# Patient Record
Sex: Female | Born: 1965 | Race: Black or African American | Hispanic: No | Marital: Married | Smoking: Former smoker
Health system: Southern US, Community
[De-identification: ages and names within clinical notes are randomized; demographics above are authoritative.]

## PROBLEM LIST (undated history)

## (undated) DIAGNOSIS — I10 Essential (primary) hypertension: Secondary | ICD-10-CM

## (undated) DIAGNOSIS — R7303 Prediabetes: Secondary | ICD-10-CM

## (undated) DIAGNOSIS — L309 Dermatitis, unspecified: Secondary | ICD-10-CM

## (undated) DIAGNOSIS — R001 Bradycardia, unspecified: Secondary | ICD-10-CM

## (undated) DIAGNOSIS — E785 Hyperlipidemia, unspecified: Secondary | ICD-10-CM

## (undated) DIAGNOSIS — E669 Obesity, unspecified: Secondary | ICD-10-CM

## (undated) DIAGNOSIS — L83 Acanthosis nigricans: Secondary | ICD-10-CM

## (undated) HISTORY — DX: Dermatitis, unspecified: L30.9

## (undated) HISTORY — DX: Bradycardia, unspecified: R00.1

## (undated) HISTORY — DX: Essential (primary) hypertension: I10

## (undated) HISTORY — DX: Acanthosis nigricans: L83

## (undated) HISTORY — PX: ABDOMINAL HYSTERECTOMY: SHX81

## (undated) HISTORY — PX: SKIN BIOPSY: SHX1

## (undated) HISTORY — DX: Hyperlipidemia, unspecified: E78.5

## (undated) HISTORY — DX: Obesity, unspecified: E66.9

---
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
## 2005-08-07 ENCOUNTER — Other Ambulatory Visit: Payer: Self-pay

## 2005-08-22 ENCOUNTER — Ambulatory Visit: Payer: Self-pay | Admitting: Unknown Physician Specialty

## 2005-12-05 IMAGING — CR DG CHEST 2V
1 series · 2 of 2 positions shown · non-contrast
Comparison: none

REASON FOR EXAM: Hypertension
COMMENTS:

PROCEDURE:     DXR - DXR CHEST PA (OR AP) AND LATERAL  - August 07, 2005 [DATE]
RESULT:     PA and lateral views reveal the heart to be within normal limits
in size. The lung fields are clear. The vascularity is within normal limits
with no effusions identified.

[Series 2464: postero_anterior · 0.11mm/px · 2 of 2 slices shown]
[im 1/2]
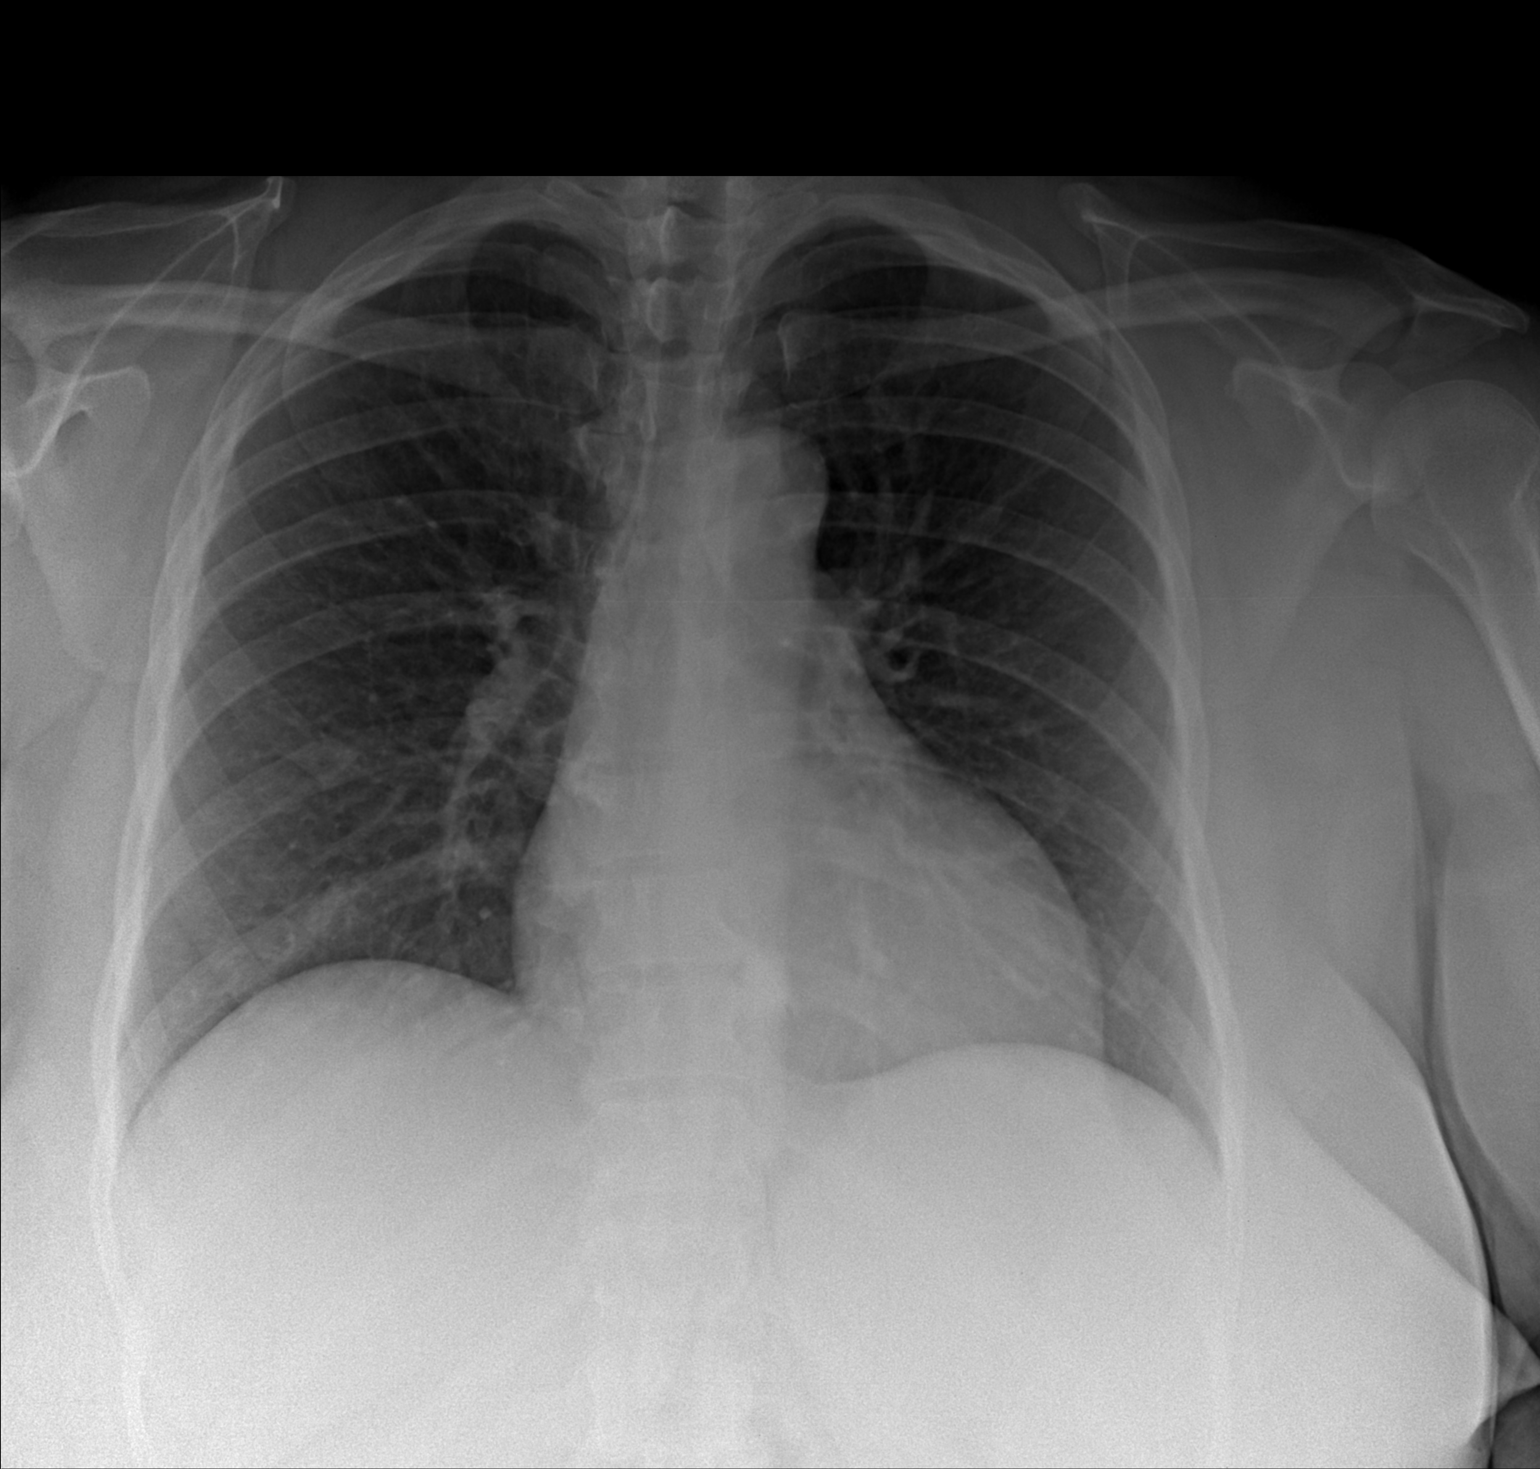
[im 2/2]
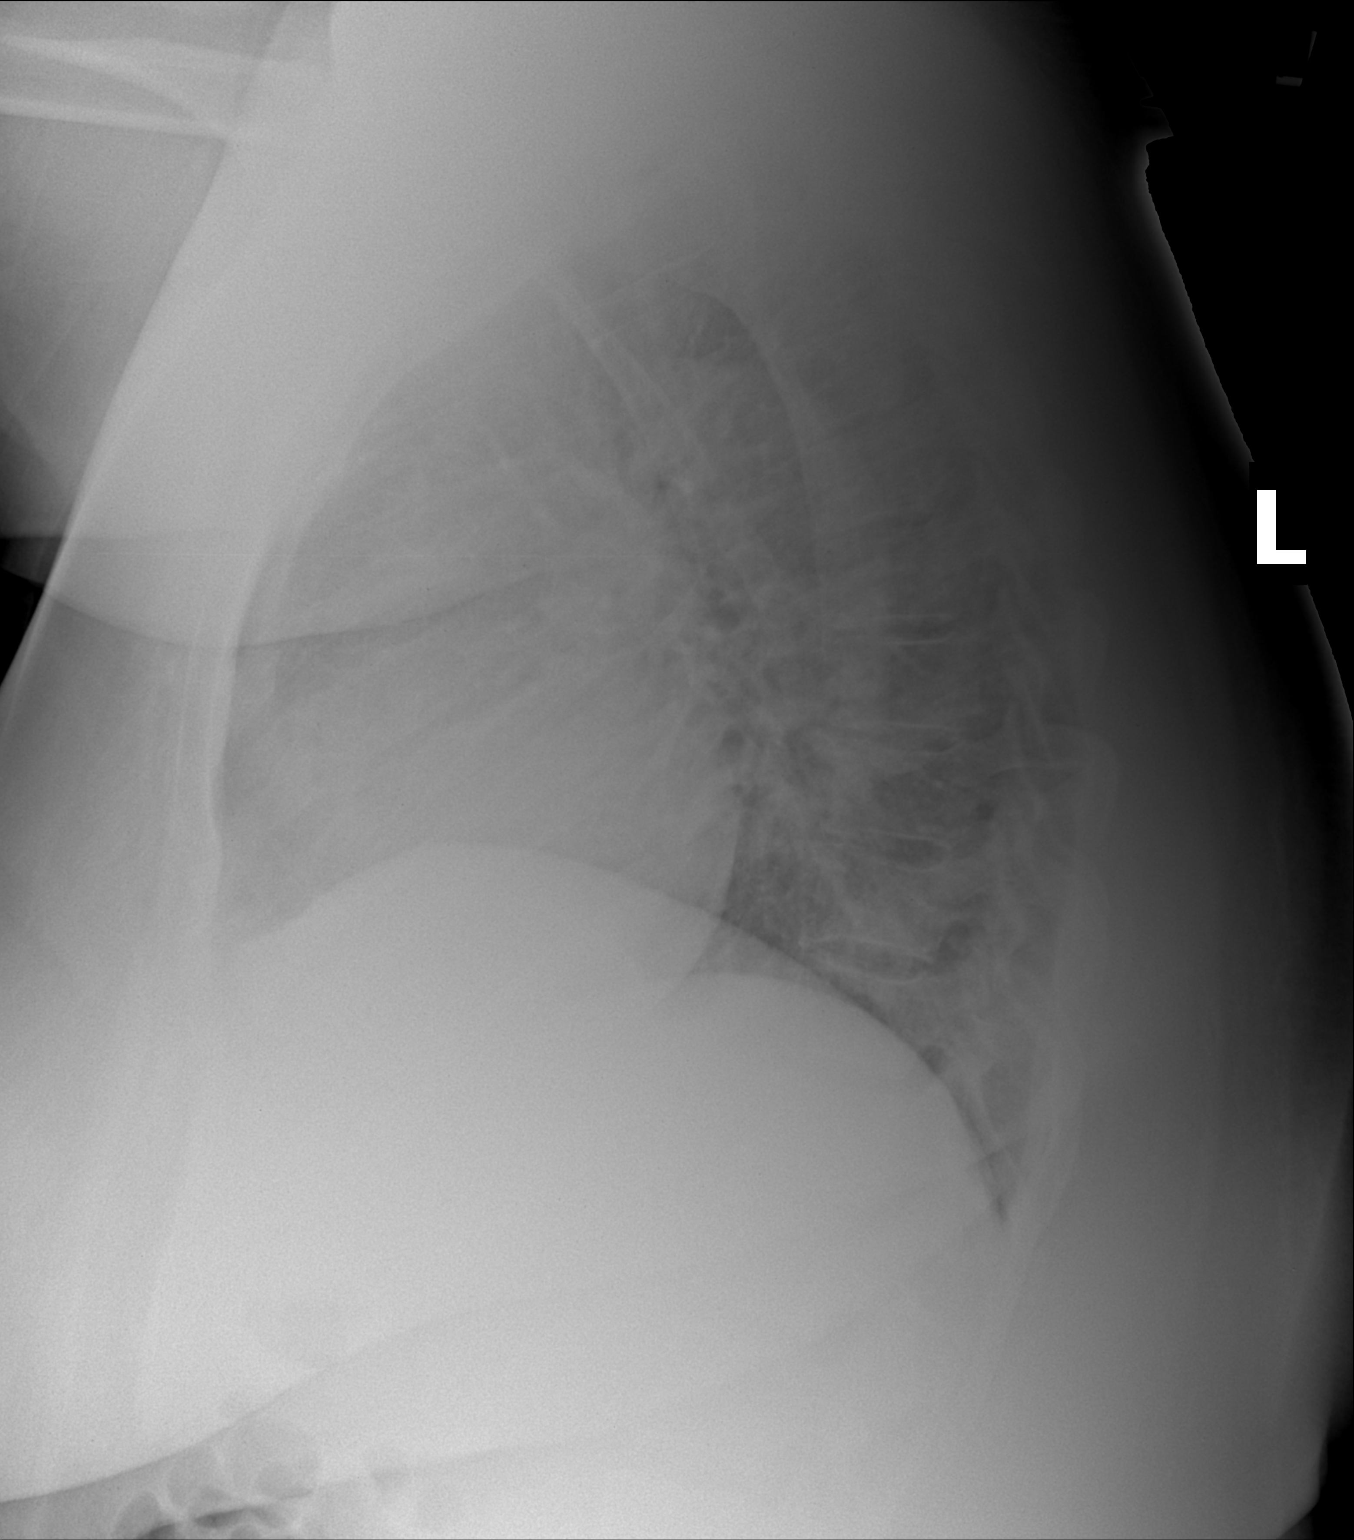

[2 of 2 positions shown; findings below may reference images not displayed]

IMPRESSION: The lung fields are clear.

## 2009-12-10 LAB — HM MAMMOGRAPHY: HM Mammogram: NORMAL

## 2011-07-19 ENCOUNTER — Encounter: Payer: Self-pay | Admitting: Internal Medicine

## 2011-07-24 ENCOUNTER — Ambulatory Visit: Payer: BC Managed Care – PPO | Admitting: Internal Medicine

## 2011-11-05 ENCOUNTER — Other Ambulatory Visit: Payer: Self-pay | Admitting: Internal Medicine

## 2011-11-06 ENCOUNTER — Other Ambulatory Visit: Payer: Self-pay | Admitting: Internal Medicine

## 2011-11-22 ENCOUNTER — Encounter: Payer: Self-pay | Admitting: Internal Medicine

## 2011-12-12 ENCOUNTER — Encounter: Payer: Self-pay | Admitting: Internal Medicine

## 2011-12-12 ENCOUNTER — Ambulatory Visit (INDEPENDENT_AMBULATORY_CARE_PROVIDER_SITE_OTHER): Payer: BC Managed Care – PPO | Admitting: Internal Medicine

## 2011-12-12 VITALS — BP 132/86 | HR 90 | Temp 98.0°F | Resp 18 | Ht 65.0 in | Wt 286.0 lb

## 2011-12-12 DIAGNOSIS — Z79899 Other long term (current) drug therapy: Secondary | ICD-10-CM

## 2011-12-12 DIAGNOSIS — I10 Essential (primary) hypertension: Secondary | ICD-10-CM

## 2011-12-12 DIAGNOSIS — R7302 Impaired glucose tolerance (oral): Secondary | ICD-10-CM

## 2011-12-12 DIAGNOSIS — E785 Hyperlipidemia, unspecified: Secondary | ICD-10-CM | POA: Insufficient documentation

## 2011-12-12 DIAGNOSIS — I1A0 Resistant hypertension: Secondary | ICD-10-CM | POA: Insufficient documentation

## 2011-12-12 DIAGNOSIS — R7309 Other abnormal glucose: Secondary | ICD-10-CM

## 2011-12-12 DIAGNOSIS — R7303 Prediabetes: Secondary | ICD-10-CM | POA: Insufficient documentation

## 2011-12-12 DIAGNOSIS — E669 Obesity, unspecified: Secondary | ICD-10-CM

## 2011-12-12 LAB — COMPLETE METABOLIC PANEL WITH GFR
ALT: 15 U/L (ref 0–35)
AST: 34 U/L (ref 0–37)
Albumin: 4.2 g/dL (ref 3.5–5.2)
Alkaline Phosphatase: 96 U/L (ref 39–117)
Glucose, Bld: 98 mg/dL (ref 70–99)
Potassium: 5 mEq/L (ref 3.5–5.3)
Sodium: 138 mEq/L (ref 135–145)
Total Protein: 6.9 g/dL (ref 6.0–8.3)

## 2011-12-12 LAB — LIPID PANEL
Total CHOL/HDL Ratio: 5
VLDL: 21.8 mg/dL (ref 0.0–40.0)

## 2011-12-12 MED ORDER — AMLODIPINE BESYLATE 10 MG PO TABS: 10.0000 mg | ORAL_TABLET | Freq: Every day | ORAL | Status: DC

## 2011-12-12 MED ORDER — TRIAMTERENE-HCTZ 37.5-25 MG PO TABS: 1.0000 | ORAL_TABLET | Freq: Every day | ORAL | Status: DC

## 2011-12-12 MED ORDER — LISINOPRIL 40 MG PO TABS: 40.0000 mg | ORAL_TABLET | Freq: Every day | ORAL | Status: DC

## 2011-12-12 NOTE — Patient Instructions (Addendum)
Consider the Low Glycemic Index Diet and 6 smaller meals daily :   7 AM Low carbohydrate Protein  Shakes (EAS Carb Control  Or Atkins ,  Available everywhere,   In  cases at BJs )  2.5 carbs  (Add or substitute a toasted sandwhich thin w/ peanut butter)  10 AM: Protein bar by Atkins (snack size,  Chocolate lover's variety at  BJ's)    Lunch: sandwich on pita bread or flatbread (Joseph's makes a pita bread and a flat bread , available at [employer] and BJ's; Toufayah makes a low carb flatbread available at [employer] and [employer]) [employer] and Mission both make a low carb whole wheat tortilla  3 PM:  Mid day :  Another protein bar,  Or a  cheese stick, 1/4 cup of almonds, walnuts, pistachios, pecans, peanuts,  Macadamia nuts  6 PM  Dinner:  "mean and green:"  Meat/chicken/fish, salad, and green veggie : use ranch, vinagrette,  Blue cheese, etc  9 PM snack : Breyer's low carb fudgsicle or  ice cream bar (Carb Smart) Weight Watcher's ice cream bar , or another protein shake or a Therapist, nutritional

## 2011-12-12 NOTE — Progress Notes (Signed)
Patient ID: Amy House, female   DOB: [birth year], 46 y.o.   MRN: [id number]  Patient Active Problem List  Diagnoses  . Hypertension  . Obesity (BMI 30-39.9)  . Hyperlipidemia  . Impaired glucose tolerance    Subjective:  CC:   Chief Complaint  Patient presents with  . Follow-up    HPI:   Amy House a 46 y.o. female who presents  Follow up onobesity and hypertension.  She has not been seen in over 8 months,  She ha  not lost weight ,  Not exercising regularly. Considering enrolling in the bariatric clinic around the corner from her house. Frustrated at her lack of knowledge about what to eat.  Has been worried about salt and sugar content of foods,  But is eating Honey nut cheerios,  Crackers and fruit for breakfast, prepared past meals,  Doesn't really understand what to avoid. Taking her bp medications regularly,  No side effects.  Home bps have been 130/80 or less.    Past Medical History  Diagnosis Date  . Bradycardia   . Hyperlipidemia   . Hypertension   . Obesity (BMI 30-39.9)     Past Surgical History  Procedure Date  . Abdominal hysterectomy   . Tonsillectomy 1979         The following portions of the patient's history were reviewed and updated as appropriate: Allergies, current medications, and problem list.    Review of Systems:   12 Pt  review of systems was negative except those addressed in the HPI,     History   Social History  . Marital Status: Married    Spouse Name: N/A    Number of Children: N/A  . Years of Education: N/A   Occupational History  . Not on file.   Social History Main Topics  . Smoking status: Never Smoker   . Smokeless tobacco: Never Used  . Alcohol Use: No  . Drug Use: No  . Sexually Active: Not on file   Other Topics Concern  . Not on file   Social History Narrative  . No narrative on file    Objective:  BP 132/86  Pulse 90  Temp(Src) 98 F (36.7 C) (Oral)  Resp 18  Ht 5\' 5"  (1.651 m)   Wt 286 lb (129.729 kg)  BMI 47.59 kg/m2  SpO2 97%  General appearance: alert, cooperative and appears stated age Ears: normal TM's and external ear canals both ears Throat: lips, mucosa, and tongue normal; teeth and gums normal Neck: no adenopathy, no carotid bruit, supple, symmetrical, trachea midline and thyroid not enlarged, symmetric, no tenderness/mass/nodules Back: symmetric, no curvature. ROM normal. No CVA tenderness. Lungs: clear to auscultation bilaterally Heart: regular rate and rhythm, S1, S2 normal, no murmur, click, rub or gallop Abdomen: soft, non-tender; bowel sounds normal; no masses,  no organomegaly Pulses: 2+ and symmetric Skin: Skin color, texture, turgor normal. No rashes or lesions Lymph nodes: Cervical, supraclavicular, and axillary nodes normal.  Assessment and Plan:  Hypertension Slightly elevated,as whit e coat hypertension.  Home bps have been 130/80 or less  Impaired glucose tolerance a1c was 5.9 last July, with no diagnostic fasting  glucoses. Will recheck, recommend low GI diet.   Obesity (BMI 30-39.9) I have addressed  BMI and recommended a low glycemic index diet utilizing smaller more frequent meals to increase metabolism.  I have also recommended that patient start exercising with a goal of 30 minutes of aerobic exercise a minimum of 5 days per week.  Screening for lipid disorders, thyroid and diabetes to be done today. Spent 20 minutes today discussing diet.      Updated Medication List Outpatient Encounter Prescriptions as of 12/12/2011  Medication Sig Dispense Refill  . amLODipine (NORVASC) 10 MG tablet Take 1 tablet (10 mg total) by mouth daily.  30 tablet  6  . aspirin 81 MG tablet Take 81 mg by mouth daily.        Marland Kitchen lisinopril (PRINIVIL,ZESTRIL) 40 MG tablet Take 1 tablet (40 mg total) by mouth daily.  30 tablet  3  . triamterene-hydrochlorothiazide (MAXZIDE-25) 37.5-25 MG per tablet Take 1 each (1 tablet total) by mouth daily.  30 tablet  3    . DISCONTD: amLODipine (NORVASC) 10 MG tablet TAKE ONE TABLET BY MOUTH EVERY DAY FOR BLOOD PRESSURE  30 tablet  6  . DISCONTD: lisinopril (PRINIVIL,ZESTRIL) 40 MG tablet Take 40 mg by mouth daily.        Marland Kitchen DISCONTD: triamterene-hydrochlorothiazide (MAXZIDE-25) 37.5-25 MG per tablet Take 1 tablet by mouth daily.        Marland Kitchen DISCONTD: fish oil-omega-3 fatty acids 1000 MG capsule Take 2 g by mouth daily.        Marland Kitchen DISCONTD: hydrochlorothiazide (HYDRODIURIL) 25 MG tablet Take 25 mg by mouth daily.        Marland Kitchen DISCONTD: potassium chloride SA (K-DUR,KLOR-CON) 20 MEQ tablet Take 20 mEq by mouth 2 (two) times daily.           Orders Placed This Encounter  Procedures  . TSH  . Lipid panel  . COMPLETE METABOLIC PANEL WITH GFR  . Hemoglobin A1c    Return in about 3 months (around 03/12/2012).

## 2011-12-12 NOTE — Assessment & Plan Note (Signed)
a1c was 5.9 last July, with no diagnostic fasting  glucoses. Will recheck, recommend low GI diet.

## 2011-12-12 NOTE — Assessment & Plan Note (Addendum)
Slightly elevated,as whit e coat hypertension.  Home bps have been 130/80 or less

## 2011-12-12 NOTE — Assessment & Plan Note (Signed)
I have addressed  BMI and recommended a low glycemic index diet utilizing smaller more frequent meals to increase metabolism.  I have also recommended that patient start exercising with a goal of 30 minutes of aerobic exercise a minimum of 5 days per week. Screening for lipid disorders, thyroid and diabetes to be done today. Spent 20 minutes today discussing diet.

## 2012-08-05 ENCOUNTER — Ambulatory Visit: Payer: BC Managed Care – PPO | Admitting: Internal Medicine

## 2012-08-07 ENCOUNTER — Encounter: Payer: Self-pay | Admitting: Internal Medicine

## 2012-08-07 ENCOUNTER — Ambulatory Visit (INDEPENDENT_AMBULATORY_CARE_PROVIDER_SITE_OTHER): Payer: BC Managed Care – PPO | Admitting: Internal Medicine

## 2012-08-07 VITALS — BP 136/80 | HR 86 | Temp 98.1°F | Resp 12 | Ht 65.0 in | Wt 290.5 lb

## 2012-08-07 DIAGNOSIS — E669 Obesity, unspecified: Secondary | ICD-10-CM

## 2012-08-07 DIAGNOSIS — R5383 Other fatigue: Secondary | ICD-10-CM

## 2012-08-07 DIAGNOSIS — J069 Acute upper respiratory infection, unspecified: Secondary | ICD-10-CM

## 2012-08-07 DIAGNOSIS — R5381 Other malaise: Secondary | ICD-10-CM

## 2012-08-07 DIAGNOSIS — E785 Hyperlipidemia, unspecified: Secondary | ICD-10-CM

## 2012-08-07 DIAGNOSIS — M549 Dorsalgia, unspecified: Secondary | ICD-10-CM

## 2012-08-07 DIAGNOSIS — I10 Essential (primary) hypertension: Secondary | ICD-10-CM

## 2012-08-07 LAB — POCT URINALYSIS DIPSTICK
Bilirubin, UA: NEGATIVE
Glucose, UA: NEGATIVE
Ketones, UA: NEGATIVE
Spec Grav, UA: 1.02

## 2012-08-07 MED ORDER — LEVOFLOXACIN 500 MG PO TABS: 500.0000 mg | ORAL_TABLET | Freq: Every day | ORAL | Status: DC

## 2012-08-07 NOTE — Patient Instructions (Addendum)
  You have a viral  Syndrome .  The post nasal drip is causing your sore throat.  Lavage your sinuses twice daily with Simply saline nasal spray.  Use generic benadryl 25 mg every 8 hours for drainage and Sudafed PE 10 to 30 every 8 hours to manage the congestion.  Gargle with salt water often for the sore throat.  Try OTC Delsym as a cough suppressant or Robitussin DM if you need to thin out your drainage .  If the throat is no better  In 3 to 4 days OR  if you develop T > 100.4,  Green nasal discharge,  Or facial pain, start the levaquin.

## 2012-08-07 NOTE — Progress Notes (Signed)
Patient ID: Amy House, female   DOB: 1966-08-10, 46 y.o.   MRN: [id number]  Patient Active Problem List  Diagnosis  . Hypertension  . Obesity (BMI 30-39.9)  . Hyperlipidemia  . Impaired glucose tolerance  . Acute URI of multiple sites    Subjective:  CC:   Chief Complaint  Patient presents with  . Sinus Problem    HPI:   Amy House a 46 y.o. female who presents with a 2 week history of sore throat, sinus drainage accompanied by productive cough.  No fevers or chills.  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  She has tried gargling with salt water for scratchy throat with improvement but continues to have frequent cough and sinus congestion with mild headache. 2) obesity.  She is not exercising regularly and not following any specific diet.  She is currently unmotivated but wants to find a companion to start exercising with and is considering going to the bariatric Center for  a formalized weight loss program which inclues B12 injections.    Past Medical History  Diagnosis Date  . Bradycardia   . Hyperlipidemia   . Hypertension   . Obesity (BMI 30-39.9)     Past Surgical History  Procedure Date  . Abdominal hysterectomy   . Tonsillectomy 1979     The following portions of the patient's history were reviewed and updated as appropriate: Allergies, current medications, and problem list.  Review of Systems:   Patient denies  fevers,  unintentional weight loss, skin rash, eye pain, dysphagia,  hemoptysis , dyspnea, wheezing, chest pain, palpitations, orthopnea, edema, abdominal pain, nausea, melena, diarrhea, constipation, flank pain, dysuria, hematuria, urinary frequency, nocturia, numbness, tingling, seizures,  Focal weakness, Loss of consciousness,  Tremor, insomnia, depression, anxiety, and suicidal ideation.         History   Social History  . Marital Status: Married    Spouse Name: N/A    Number of Children: N/A  . Years of Education: N/A   Occupational  History  . Not on file.   Social History Main Topics  . Smoking status: Never Smoker   . Smokeless tobacco: Never Used  . Alcohol Use: No  . Drug Use: No  . Sexually Active: Not on file   Other Topics Concern  . Not on file   Social History Narrative  . No narrative on file    Objective:  BP 136/80  Pulse 86  Temp 98.1 F (36.7 C) (Oral)  Resp 12  Ht 5\' 5"  (1.651 m)  Wt 290 lb 8 oz (131.77 kg)  BMI 48.34 kg/m2  SpO2 97%  General appearance: alert, cooperative and appears stated age Ears: normal TM's and external ear canals both ears Throat: lips, mucosa, and tongue normal; teeth and gums normal Neck: no adenopathy, no carotid bruit, supple, symmetrical, trachea midline and thyroid not enlarged, symmetric, no tenderness/mass/nodules Back: symmetric, no curvature. ROM normal. No CVA tenderness. Lungs: clear to auscultation bilaterally Heart: regular rate and rhythm, S1, S2 normal, no murmur, click, rub or gallop Abdomen: soft, non-tender; bowel sounds normal; no masses,  no organomegaly Pulses: 2+ and symmetric Skin: Skin color, texture, turgor normal. No rashes or lesions Lymph nodes: Cervical, supraclavicular, and axillary nodes normal.  Assessment and Plan:  Acute URI of multiple sites This URI is most likely viral given the persistence of mild HEENT  symptoms .  I have explained that in viral URIS, an antibiotic will not help the symptoms and will increase the risk of developing diarrhea.,  Continue oral and nasal decongestants,  Ibuprofen 400 mg and tylenol 650 mq 8 hrs for aches and pains,  Add  abx only if symptoms worsen to include fevers, facial pain, purulent sputum./drainage.   Hypertension Well controlled on current regimen., no changes today.  Obesity (BMI 30-39.9) I have addressed  BMI and recommended a low glycemic index diet utilizing smaller more frequent meals to increase metabolism.  I have also recommended that patient start exercising with a goal  of 30 minutes of aerobic exercise a minimum of 5 days per week. Screening for lipid disorders, thyroid and diabetes to be done today.     Updated Medication List Outpatient Encounter Prescriptions as of 08/07/2012  Medication Sig Dispense Refill  . amLODipine (NORVASC) 10 MG tablet Take 1 tablet (10 mg total) by mouth daily.  30 tablet  6  . aspirin 81 MG tablet Take 81 mg by mouth daily.        Marland Kitchen lisinopril (PRINIVIL,ZESTRIL) 40 MG tablet Take 1 tablet (40 mg total) by mouth daily.  30 tablet  3  . triamterene-hydrochlorothiazide (MAXZIDE-25) 37.5-25 MG per tablet Take 1 each (1 tablet total) by mouth daily.  30 tablet  3  . levofloxacin (LEVAQUIN) 500 MG tablet Take 1 tablet (500 mg total) by mouth daily.  7 tablet  0     Orders Placed This Encounter  Procedures  . Urine culture  . Lipid panel  . Microalbumin / creatinine urine ratio  . Comprehensive metabolic panel  . TSH  . POCT Urinalysis Dipstick    No Follow-up on file.

## 2012-08-08 ENCOUNTER — Encounter: Payer: Self-pay | Admitting: Internal Medicine

## 2012-08-08 DIAGNOSIS — J069 Acute upper respiratory infection, unspecified: Secondary | ICD-10-CM | POA: Insufficient documentation

## 2012-08-08 LAB — URINE CULTURE: Organism ID, Bacteria: NO GROWTH

## 2012-08-08 NOTE — Assessment & Plan Note (Signed)
I have addressed  BMI and recommended a low glycemic index diet utilizing smaller more frequent meals to increase metabolism.  I have also recommended that patient start exercising with a goal of 30 minutes of aerobic exercise a minimum of 5 days per week. Screening for lipid disorders, thyroid and diabetes to be done today.   

## 2012-08-08 NOTE — Assessment & Plan Note (Signed)
This URI is most likely viral given the persistence of mild HEENT  symptoms .  I have explained that in viral URIS, an antibiotic will not help the symptoms and will increase the risk of developing diarrhea.,  Continue oral and nasal decongestants,  Ibuprofen 400 mg and tylenol 650 mq 8 hrs for aches and pains,  Add  abx only if symptoms worsen to include fevers, facial pain, purulent sputum./drainage.

## 2012-08-08 NOTE — Assessment & Plan Note (Signed)
Well controlled on current regimen, no changes today. 

## 2012-09-05 ENCOUNTER — Telehealth: Payer: Self-pay | Admitting: Internal Medicine

## 2012-09-05 MED ORDER — LISINOPRIL 40 MG PO TABS: 40.0000 mg | ORAL_TABLET | Freq: Every day | ORAL | Status: DC

## 2012-09-05 NOTE — Telephone Encounter (Signed)
Med filled.  

## 2012-09-05 NOTE — Telephone Encounter (Signed)
Refill request for lisinopril 40 mg tab Qty: 30 Sig: take one tablet by mouth every day

## 2012-10-02 LAB — HM MAMMOGRAPHY: HM Mammogram: NORMAL

## 2012-10-18 ENCOUNTER — Other Ambulatory Visit: Payer: Self-pay | Admitting: General Practice

## 2012-10-18 MED ORDER — AMLODIPINE BESYLATE 10 MG PO TABS: 10.0000 mg | ORAL_TABLET | Freq: Every day | ORAL | Status: DC

## 2012-10-18 NOTE — Telephone Encounter (Signed)
Pt called requesting amlodipine refill. Med filled.

## 2012-10-18 NOTE — Telephone Encounter (Signed)
Fixed should have been 90 day not 30 day supply.

## 2013-03-13 ENCOUNTER — Telehealth: Payer: Self-pay | Admitting: Internal Medicine

## 2013-03-13 DIAGNOSIS — Z79899 Other long term (current) drug therapy: Secondary | ICD-10-CM

## 2013-03-13 DIAGNOSIS — E785 Hyperlipidemia, unspecified: Secondary | ICD-10-CM

## 2013-03-13 NOTE — Addendum Note (Signed)
Addended by: Sherlene Shams on: 03/13/2013 12:50 PM   Modules accepted: Orders

## 2013-03-13 NOTE — Telephone Encounter (Signed)
Pt made an appt for BP check that has been scheduled for 7/18 at 10 a.m.  Pt states she should needs labs done at that time.

## 2013-03-28 ENCOUNTER — Ambulatory Visit: Payer: BC Managed Care – PPO | Admitting: Internal Medicine

## 2013-04-01 ENCOUNTER — Encounter: Payer: Self-pay | Admitting: Internal Medicine

## 2013-04-01 ENCOUNTER — Ambulatory Visit (INDEPENDENT_AMBULATORY_CARE_PROVIDER_SITE_OTHER): Payer: BC Managed Care – PPO | Admitting: Internal Medicine

## 2013-04-01 VITALS — BP 120/68 | HR 78 | Temp 98.0°F | Resp 14 | Wt 291.2 lb

## 2013-04-01 DIAGNOSIS — I10 Essential (primary) hypertension: Secondary | ICD-10-CM

## 2013-04-01 DIAGNOSIS — E785 Hyperlipidemia, unspecified: Secondary | ICD-10-CM

## 2013-04-01 DIAGNOSIS — R7302 Impaired glucose tolerance (oral): Secondary | ICD-10-CM

## 2013-04-01 DIAGNOSIS — R7309 Other abnormal glucose: Secondary | ICD-10-CM

## 2013-04-01 DIAGNOSIS — E669 Obesity, unspecified: Secondary | ICD-10-CM

## 2013-04-01 DIAGNOSIS — R350 Frequency of micturition: Secondary | ICD-10-CM

## 2013-04-01 DIAGNOSIS — B009 Herpesviral infection, unspecified: Secondary | ICD-10-CM

## 2013-04-01 LAB — COMPREHENSIVE METABOLIC PANEL
ALT: 16 U/L (ref 0–35)
AST: 19 U/L (ref 0–37)
Albumin: 4 g/dL (ref 3.5–5.2)
Alkaline Phosphatase: 106 U/L (ref 39–117)
BUN: 11 mg/dL (ref 6–23)
Chloride: 101 mEq/L (ref 96–112)
Potassium: 3.7 mEq/L (ref 3.5–5.1)

## 2013-04-01 LAB — URINALYSIS, ROUTINE W REFLEX MICROSCOPIC
Bilirubin Urine: NEGATIVE
Nitrite: NEGATIVE
Total Protein, Urine: NEGATIVE
Urine Glucose: NEGATIVE
pH: 6 (ref 5.0–8.0)

## 2013-04-01 LAB — LIPID PANEL: Cholesterol: 186 mg/dL (ref 0–200)

## 2013-04-01 LAB — MICROALBUMIN / CREATININE URINE RATIO
Creatinine,U: 122.3 mg/dL
Microalb Creat Ratio: 1.5 mg/g (ref 0.0–30.0)
Microalb, Ur: 1.8 mg/dL (ref 0.0–1.9)

## 2013-04-01 NOTE — Patient Instructions (Addendum)
I want you to lose 30 lbs over the next 6 months  (10% of your body weight)  I am ok with you trying the phentermine if you return in one month for recheck on weight and BP   This is  One version of a  "Low GI"  Diet:  It will still lower your blood sugars and allow you to lose 4 to 8  lbs  per month if you follow it carefully.  Your goal with exercise is a minimum of 30 minutes of aerobic exercise 5 days per week (Walking does not count once it becomes easy!)    All of the foods can be found at grocery stores and in bulk at [employer].  The Atkins protein bars and shakes are available in more varieties at Target, WalMart and [employer].     7 AM Breakfast:  Choose from the following:  Low carbohydrate Protein  Shakes (I recommend the EAS AdvantEdge "Carb Control" shakes  Or the low carb shakes by Atkins.    2.5 carbs   Arnold's "Sandwhich Thin"toasted  w/ peanut butter (no jelly: about 20 net carbs  "Bagel Thin" with cream cheese and salmon: about 20 carbs   a scrambled egg/bacon/cheese burrito made with [employer]'s "carb balance" whole wheat tortilla  (about 10 net carbs )   Avoid cereal and bananas, oatmeal and cream of wheat and grits. They are loaded with carbohydrates!   10 AM: high protein snack  Protein bar by Atkins (the snack size, under 200 cal, usually < 6 net carbs).    A stick of cheese:  Around 1 carb,  100 cal     Dannon Light n Fit [place] Yogurt  (80 cal, 8 carbs)  Other so called "protein bars" and Greek yogurts tend to be loaded with carbohydrates.  Remember, in food advertising, the word "energy" is synonymous for " carbohydrate."  Lunch:   A Sandwich using the bread choices listed, Can use any  Eggs,  lunchmeat, grilled meat or canned tuna), avocado, regular mayo/mustard  and cheese.  A Salad using blue cheese, ranch,  Goddess or vinagrette,  No croutons or "confetti" and no "candied nuts" but regular nuts OK.   No pretzels or chips.  Pickles and miniature sweet peppers  are a good low carb alternative that provide a "crunch"  The bread is the only source of carbohydrate in a sandwich and  can be decreased by trying some of these alternatives to traditional loaf bread  Joseph's makes a pita bread and a flat bread that are 50 cal and 4 net carbs available at BJs and WalMart.  This can be toasted to use with hummous as well  Toufayan makes a low carb flatbread that's 100 cal and 9 net carbs available at [employer] and [employer] makes 2 sizes of  Low carb whole wheat tortilla  (The large one is 210 cal and 6 net carbs) Avoid "Low fat dressings, as well as Reyne Dumas and [address] dressings They are loaded with sugar!   3 PM/ Mid day  Snack:  Consider  1 ounce of  almonds, walnuts, pistachios, pecans, peanuts,  Macadamia nuts or a nut medley.  Avoid "granola"; the dried cranberries and raisins are loaded with carbohydrates. Mixed nuts as long as there are no raisins,  cranberries or dried fruit.     6 PM  Dinner:     Meat/fowl/fish with a green salad, and either broccoli, cauliflower, green beans, spinach,  brussel sprouts or  Lima beans. DO NOT BREAD THE PROTEIN!!      There is a low carb pasta by Dreamfield's that is acceptable and tastes great: only 5 digestible carbs/serving.( All grocery stores but BJs carry it )  Try Kai Levins Angelo's chicken piccata or chicken or eggplant parm over low carb pasta.(Lowes and BJs)   Clifton Custard Sanchez's "Carnitas" (pulled pork, no sauce,  0 carbs) or his beef pot roast to make a dinner burrito (at BJ's)  Pesto over low carb pasta (bj's sells a good quality pesto in the center refrigerated section of the deli   Whole wheat pasta is still full of digestible carbs and  Not as low in glycemic index as Dreamfield's.   Brown rice is still rice,  So skip the rice and noodles if you eat [place] or [place] (or at least limit to 1/2 cup)  9 PM snack :   Breyer's "low carb" fudgsicle or  ice cream bar (Carb Smart line), or  Weight  Watcher's ice cream bar , or another "no sugar added" ice cream;  a serving of fresh berries/cherries with whipped cream   Cheese or D[employer]S [employer] [place] YOGURT  Avoid bananas, pineapple, grapes  and watermelon on a regular basis because they are high in sugar.  THINK OF THEM AS DESSERT  Remember that snack Substitutions should be less than 10 NET carbs per serving and meals < 20 carbs. Remember to subtract fiber grams to get the "net carbs."

## 2013-04-01 NOTE — Assessment & Plan Note (Addendum)
U/A is normal today °

## 2013-04-01 NOTE — Progress Notes (Signed)
Patient ID: Amy House, female   DOB: [birth year], 47 y.o.   MRN: [id number]  Patient Active Problem List   Diagnosis Date Noted  . Acute URI of multiple sites 08/08/2012  . Hypertension 12/12/2011  . Obesity (BMI 30-39.9) 12/12/2011  . Impaired glucose tolerance 12/12/2011  . Hyperlipidemia     Subjective:  CC:   Chief Complaint  Patient presents with  . Follow-up    Blood presasure    HPI:   Amy House a 47 y.o. female who presents 6 month follow up on hypertension, hyperlipidemia and obesity.  She feels well and has no new complaints.  During her recent gynecologic exam with  Dr. Tiburcio Pea, she was noted to have mild bladder prolapse .  She is s/p hysterectomy.  A pessary was not prescribed an no UA was done although she has been having urinary frequency .  She was given a prescription for phentermine  But has not tried it.  She states that she is exercising several times per week but has not lost any weight.    Past Medical History  Diagnosis Date  . Bradycardia   . Hyperlipidemia   . Hypertension   . Obesity (BMI 30-39.9)     Past Surgical History  Procedure Laterality Date  . Abdominal hysterectomy    . Tonsillectomy  1979     The following portions of the patient's history were reviewed and updated as appropriate: Allergies, current medications, and problem list   Review of Systems:   Patient denies headache, fevers, malaise, unintentional weight loss, skin rash, eye pain, sinus congestion and sinus pain, sore throat, dysphagia,  hemoptysis , cough, dyspnea, wheezing, chest pain, palpitations, orthopnea, edema, abdominal pain, nausea, melena, diarrhea, constipation, flank pain, dysuria, hematuria, urinary  Frequency, nocturia, numbness, tingling, seizures,  Focal weakness, Loss of consciousness,  Tremor, insomnia, depression, anxiety, and suicidal ideation.     History   Social History  . Marital Status: Married    Spouse Name: N/A    Number of  Children: N/A  . Years of Education: N/A   Occupational History  . Not on file.   Social History Main Topics  . Smoking status: Former Smoker -- 0.50 packs/day    Types: Cigarettes    Quit date: 04/01/1993  . Smokeless tobacco: Never Used  . Alcohol Use: No  . Drug Use: No  . Sexually Active: Not on file   Other Topics Concern  . Not on file   Social History Narrative  . No narrative on file    Objective:  BP 120/68  Pulse 78  Temp(Src) 98 F (36.7 C) (Oral)  Resp 14  Wt 291 lb 4 oz (132.11 kg)  BMI 48.47 kg/m2  SpO2 98%  General appearance: alert, cooperative and appears stated age Ears: normal TM's and external ear canals both ears Throat: lips, mucosa, and tongue normal; teeth and gums normal Neck: no adenopathy, no carotid bruit, supple, symmetrical, trachea midline and thyroid not enlarged, symmetric, no tenderness/mass/nodules Back: symmetric, no curvature. ROM normal. No CVA tenderness. Lungs: clear to auscultation bilaterally Heart: regular rate and rhythm, S1, S2 normal, no murmur, click, rub or gallop Abdomen: soft, non-tender; bowel sounds normal; no masses,  no organomegaly Pulses: 2+ and symmetric Skin: Skin color, texture, turgor normal. No rashes or lesions Lymph nodes: Cervical, supraclavicular, and axillary nodes normal.  Assessment and Plan:  Obesity (BMI 30-39.9) I have addressed  BMI and recommended a low glycemic index diet utilizing smaller more frequent meals  to increase metabolism.  I have also recommended that patient start exercising with a goal of 30 minutes of aerobic exercise a minimum of 5 days per week. She is exercising but has not been following a specific diet.  Discussed the role of phentermine in a weight loss program, the risks and benefits of medication and have encouraged her to try it, but return in one month for reassessment pf vital signs. .    Hypertension Well controlled on current regimen. Renal function stable, no  changes today.  Impaired glucose tolerance a1c [place] 5,6  No signs of dm.  ueged patient to lose weight using low GI diet and exercise.   Hyperlipidemia Mild, LDL < 130,  HDL low.  Will reassess in 6 months   Urinary frequency UA is normal today   Updated Medication List Outpatient Encounter Prescriptions as of 04/01/2013  Medication Sig Dispense Refill  . amLODipine (NORVASC) 10 MG tablet Take 1 tablet (10 mg total) by mouth daily.  90 tablet  1  . aspirin 81 MG tablet Take 81 mg by mouth daily.        Marland Kitchen lisinopril (PRINIVIL,ZESTRIL) 40 MG tablet Take 1 tablet (40 mg total) by mouth daily.  30 tablet  3  . valACYclovir (VALTREX) 500 MG tablet       . [DISCONTINUED] levofloxacin (LEVAQUIN) 500 MG tablet Take 1 tablet (500 mg total) by mouth daily.  7 tablet  0  . [DISCONTINUED] triamterene-hydrochlorothiazide (MAXZIDE-25) 37.5-25 MG per tablet Take 1 each (1 tablet total) by mouth daily.  30 tablet  3   No facility-administered encounter medications on file as of 04/01/2013.

## 2013-04-01 NOTE — Assessment & Plan Note (Addendum)
I have addressed  BMI and recommended a low glycemic index diet utilizing smaller more frequent meals to increase metabolism.  I have also recommended that patient start exercising with a goal of 30 minutes of aerobic exercise a minimum of 5 days per week. She is exercising but has not been following a specific diet.  Discussed the role of phentermine in a weight loss program, the risks and benefits of medication and have encouraged her to try it, but return in one month for reassessment pf vital signs. Marland Kitchen

## 2013-04-01 NOTE — Assessment & Plan Note (Signed)
Well controlled on current regimen. Renal function stable, no changes today. 

## 2013-04-01 NOTE — Assessment & Plan Note (Signed)
Mild, LDL < 130,  HDL low.  Will reassess in 6 months

## 2013-04-01 NOTE — Assessment & Plan Note (Signed)
a1c [place] 5,6  No signs of dm.  ueged patient to lose weight using low GI diet and exercise.

## 2013-04-04 ENCOUNTER — Encounter: Payer: Self-pay | Admitting: *Deleted

## 2013-05-07 ENCOUNTER — Telehealth: Payer: Self-pay | Admitting: *Deleted

## 2013-05-07 MED ORDER — LISINOPRIL 40 MG PO TABS: 40.0000 mg | ORAL_TABLET | Freq: Every day | ORAL | Status: DC

## 2013-05-07 MED ORDER — AMLODIPINE BESYLATE 10 MG PO TABS: 10.0000 mg | ORAL_TABLET | Freq: Every day | ORAL | Status: DC

## 2013-05-26 NOTE — Telephone Encounter (Signed)
Refill sent.

## 2013-07-17 ENCOUNTER — Other Ambulatory Visit: Payer: Self-pay

## 2013-11-05 ENCOUNTER — Telehealth: Payer: Self-pay | Admitting: Internal Medicine

## 2013-11-05 MED ORDER — LISINOPRIL 40 MG PO TABS: 40.0000 mg | ORAL_TABLET | Freq: Every day | ORAL | Status: DC

## 2013-11-05 MED ORDER — AMLODIPINE BESYLATE 10 MG PO TABS: 10.0000 mg | ORAL_TABLET | Freq: Every day | ORAL | Status: DC

## 2013-11-05 NOTE — Telephone Encounter (Signed)
Refills sent

## 2013-11-05 NOTE — Telephone Encounter (Signed)
Amlodipine and lisinopril x 3 mth each.  [facility] [address] Also advised pt to contact pharmacy

## 2014-03-12 ENCOUNTER — Ambulatory Visit (INDEPENDENT_AMBULATORY_CARE_PROVIDER_SITE_OTHER): Payer: BC Managed Care – PPO | Admitting: General Surgery

## 2014-03-12 ENCOUNTER — Encounter: Payer: Self-pay | Admitting: General Surgery

## 2014-03-12 VITALS — BP 132/74 | HR 76 | Resp 14 | Ht 65.0 in | Wt 293.0 lb

## 2014-03-12 DIAGNOSIS — Z1211 Encounter for screening for malignant neoplasm of colon: Secondary | ICD-10-CM | POA: Insufficient documentation

## 2014-03-12 MED ORDER — POLYETHYLENE GLYCOL 3350 17 GM/SCOOP PO POWD: ORAL | Status: DC

## 2014-03-12 NOTE — Progress Notes (Signed)
Patient ID: Amy House, female   DOB: August 06, 1966, 48 y.o.   MRN: [id number]  Chief Complaint  Patient presents with  . Other    screening colonoscopy    HPI Amy House is a 48 y.o. female here today for a evaluation of a screening colonoscopy. No piror. Patient states she has no GI problems. No family history of colon cancer.  HPI  Past Medical History  Diagnosis Date  . Bradycardia   . Hyperlipidemia   . Hypertension   . Obesity (BMI 30-39.9)     Past Surgical History  Procedure Laterality Date  . Abdominal hysterectomy    . Tonsillectomy  1979    Family History  Problem Relation Age of Onset  . Diabetes Mother     Social History History  Substance Use Topics  . Smoking status: Former Smoker -- 0.50 packs/day    Types: Cigarettes    Quit date: 04/01/1993  . Smokeless tobacco: Never Used  . Alcohol Use: No    Allergies  Allergen Reactions  . Penicillins     Current Outpatient Prescriptions  Medication Sig Dispense Refill  . amLODipine (NORVASC) 10 MG tablet Take 1 tablet (10 mg total) by mouth daily.  90 tablet  0  . aspirin 81 MG tablet Take 81 mg by mouth daily.        Marland Kitchen. lisinopril (PRINIVIL,ZESTRIL) 40 MG tablet Take 1 tablet (40 mg total) by mouth daily.  90 tablet  0  . polyethylene glycol powder (GLYCOLAX/MIRALAX) powder 255 grams one bottle for colonoscopy prep  255 g  0  . valACYclovir (VALTREX) 500 MG tablet        No current facility-administered medications for this visit.    Review of Systems Review of Systems  Constitutional: Negative.   Respiratory: Negative.   Cardiovascular: Negative.     Blood pressure 132/74, pulse 76, resp. rate 14, height 5\' 5"  (1.651 m), weight 293 lb (132.904 kg).  Physical Exam Physical Exam  Constitutional: She is oriented to person, place, and time. She appears well-developed and well-nourished.  Eyes: Conjunctivae are normal. No scleral icterus.  Neck: Neck supple.  Cardiovascular: Normal  rate, regular rhythm and normal heart sounds.   Pulmonary/Chest: Effort normal and breath sounds normal.  Abdominal: Soft. Bowel sounds are normal. There is no tenderness.  Neurological: She is alert and oriented to person, place, and time.  Skin: Skin is dry.    Data Reviewed EMR  Assessment     Candidate for screening colonoscopy.     Plan    The risks and benefits of screening endoscopy were reviewed including those of bleeding and perforation. She was instructed in regards to preparation by the staff.  Patient has been scheduled for [facility].  It is okay for patient to continue 81 mg aspirin.     PCP: Staci Acostaullo, Teresa       Eyoel Throgmorton W 03/12/2014, 8:19 PM

## 2014-03-12 NOTE — Patient Instructions (Addendum)
Colonoscopy A colonoscopy is an exam to look at the entire large intestine (colon). This exam can help find problems such as tumors, polyps, inflammation, and areas of bleeding. The exam takes about 1 hour.  LET [facility] KNOW ABOUT:   Any allergies you have.  All medicines you are taking, including vitamins, herbs, eye drops, creams, and over-the-counter medicines.  Previous problems you or members of your family have had with the use of anesthetics.  Any blood disorders you have.  Previous surgeries you have had.  Medical conditions you have. RISKS AND COMPLICATIONS  Generally, this is a safe procedure. However, as with any procedure, complications can occur. Possible complications include:  Bleeding.  Tearing or rupture of the colon wall.  Reaction to medicines given during the exam.  Infection (rare). BEFORE THE PROCEDURE   Ask your health care provider about changing or stopping your regular medicines.  You may be prescribed an oral bowel prep. This involves drinking a large amount of medicated liquid, starting the day before your procedure. The liquid will cause you to have multiple loose stools until your stool is almost clear or light green. This cleans out your colon in preparation for the procedure.  Do not eat or drink anything else once you have started the bowel prep, unless your health care provider tells you it is safe to do so.  Arrange for someone to drive you home after the procedure. PROCEDURE   You will be given medicine to help you relax (sedative).  You will lie on your side with your knees bent.  A long, flexible tube with a light and camera on the end (colonoscope) will be inserted through the rectum and into the colon. The camera sends video back to a computer screen as it moves through the colon. The colonoscope also releases carbon dioxide gas to inflate the colon. This helps your health care provider see the area better.  During  the exam, your health care provider may take a small tissue sample (biopsy) to be examined under a microscope if any abnormalities are found.  The exam is finished when the entire colon has been viewed. AFTER THE PROCEDURE   Do not drive for 24 hours after the exam.  You may have a small amount of blood in your stool.  You may pass moderate amounts of gas and have mild abdominal cramping or bloating. This is caused by the gas used to inflate your colon during the exam.  Ask when your test results will be ready and how you will get your results. Make sure you get your test results. Document Released: 08/25/2000 Document Revised: 06/18/2013 Document Reviewed: 05/05/2013 [facility] Patient Information 2015 [place]. This information is not intended to replace advice given to you by your health care provider. Make sure you discuss any questions you have with your health care provider.  Patient has been scheduled [facility]. It is okay for patient to continue 81 mg aspirin.

## 2014-03-13 ENCOUNTER — Other Ambulatory Visit: Payer: Self-pay | Admitting: General Surgery

## 2014-03-13 DIAGNOSIS — Z1211 Encounter for screening for malignant neoplasm of colon: Secondary | ICD-10-CM

## 2014-03-22 LAB — HM COLONOSCOPY

## 2014-03-31 ENCOUNTER — Telehealth: Payer: Self-pay | Admitting: Internal Medicine

## 2014-03-31 DIAGNOSIS — R7302 Impaired glucose tolerance (oral): Secondary | ICD-10-CM

## 2014-03-31 DIAGNOSIS — E785 Hyperlipidemia, unspecified: Secondary | ICD-10-CM

## 2014-03-31 DIAGNOSIS — R5383 Other fatigue: Secondary | ICD-10-CM

## 2014-03-31 DIAGNOSIS — I10 Essential (primary) hypertension: Secondary | ICD-10-CM

## 2014-03-31 DIAGNOSIS — E669 Obesity, unspecified: Secondary | ICD-10-CM

## 2014-03-31 DIAGNOSIS — R5381 Other malaise: Secondary | ICD-10-CM

## 2014-03-31 NOTE — Telephone Encounter (Signed)
Pt called in and was needing to get a cpe done before august 17th and was wondering if she could get something done soon.

## 2014-04-01 NOTE — Telephone Encounter (Signed)
Yes, use the 4:15 slot.  Have her make appt for fasting labs prior to that appt.

## 2014-04-01 NOTE — Telephone Encounter (Signed)
The only spot I see is 04/21/14 at 4.15 would you want a CPE that late?

## 2014-04-01 NOTE — Telephone Encounter (Signed)
Patient scheduled labs and CPE

## 2014-04-08 ENCOUNTER — Ambulatory Visit: Payer: Self-pay | Admitting: General Surgery

## 2014-04-08 DIAGNOSIS — Z1211 Encounter for screening for malignant neoplasm of colon: Secondary | ICD-10-CM

## 2014-04-09 ENCOUNTER — Encounter: Payer: Self-pay | Admitting: General Surgery

## 2014-04-17 ENCOUNTER — Other Ambulatory Visit (INDEPENDENT_AMBULATORY_CARE_PROVIDER_SITE_OTHER): Payer: BC Managed Care – PPO

## 2014-04-17 DIAGNOSIS — I10 Essential (primary) hypertension: Secondary | ICD-10-CM

## 2014-04-17 DIAGNOSIS — E669 Obesity, unspecified: Secondary | ICD-10-CM

## 2014-04-17 DIAGNOSIS — E785 Hyperlipidemia, unspecified: Secondary | ICD-10-CM

## 2014-04-17 DIAGNOSIS — R7309 Other abnormal glucose: Secondary | ICD-10-CM

## 2014-04-17 DIAGNOSIS — R7302 Impaired glucose tolerance (oral): Secondary | ICD-10-CM

## 2014-04-17 LAB — LIPID PANEL
CHOLESTEROL: 172 mg/dL (ref 0–200)
HDL: 37.1 mg/dL — AB (ref >39.00)
LDL Cholesterol: 114 mg/dL — ABNORMAL HIGH (ref 0–99)
NonHDL: 134.9
Total CHOL/HDL Ratio: 5
Triglycerides: 104 mg/dL (ref 0.0–149.0)
VLDL: 20.8 mg/dL (ref 0.0–40.0)

## 2014-04-17 LAB — MICROALBUMIN / CREATININE URINE RATIO
CREATININE, U: 162.2 mg/dL
Microalb Creat Ratio: 5.6 mg/g (ref 0.0–30.0)
Microalb, Ur: 9.1 mg/dL — ABNORMAL HIGH (ref 0.0–1.9)

## 2014-04-17 LAB — COMPREHENSIVE METABOLIC PANEL
ALK PHOS: 98 U/L (ref 39–117)
ALT: 23 U/L (ref 0–35)
AST: 22 U/L (ref 0–37)
Albumin: 3.9 g/dL (ref 3.5–5.2)
BILIRUBIN TOTAL: 0.6 mg/dL (ref 0.2–1.2)
BUN: 10 mg/dL (ref 6–23)
CO2: 26 mEq/L (ref 19–32)
CREATININE: 0.8 mg/dL (ref 0.4–1.2)
Calcium: 8.8 mg/dL (ref 8.4–10.5)
Chloride: 101 mEq/L (ref 96–112)
GFR: 103.43 mL/min (ref >60.00)
Glucose, Bld: 112 mg/dL — ABNORMAL HIGH (ref 70–99)
Potassium: 3.6 mEq/L (ref 3.5–5.1)
Sodium: 138 mEq/L (ref 135–145)
Total Protein: 7.3 g/dL (ref 6.0–8.3)

## 2014-04-17 LAB — HEMOGLOBIN A1C: Hgb A1c MFr Bld: 5.7 % (ref 4.6–6.5)

## 2014-04-17 LAB — TSH: TSH: 0.81 u[IU]/mL (ref 0.35–4.50)

## 2014-04-21 ENCOUNTER — Encounter: Payer: Self-pay | Admitting: Internal Medicine

## 2014-04-21 ENCOUNTER — Ambulatory Visit (INDEPENDENT_AMBULATORY_CARE_PROVIDER_SITE_OTHER): Payer: BC Managed Care – PPO | Admitting: Internal Medicine

## 2014-04-21 VITALS — BP 142/90 | HR 92 | Temp 98.4°F | Resp 18 | Ht 65.75 in | Wt 290.5 lb

## 2014-04-21 DIAGNOSIS — R7309 Other abnormal glucose: Secondary | ICD-10-CM

## 2014-04-21 DIAGNOSIS — E785 Hyperlipidemia, unspecified: Secondary | ICD-10-CM

## 2014-04-21 DIAGNOSIS — Z6841 Body Mass Index (BMI) 40.0 and over, adult: Secondary | ICD-10-CM

## 2014-04-21 DIAGNOSIS — R7302 Impaired glucose tolerance (oral): Secondary | ICD-10-CM

## 2014-04-21 DIAGNOSIS — I10 Essential (primary) hypertension: Secondary | ICD-10-CM

## 2014-04-21 MED ORDER — LOSARTAN POTASSIUM-HCTZ 50-12.5 MG PO TABS: 1.0000 | ORAL_TABLET | Freq: Every day | ORAL | Status: DC

## 2014-04-21 MED ORDER — ALPRAZOLAM 0.25 MG PO TABS: 0.2500 mg | ORAL_TABLET | Freq: Every evening | ORAL | Status: DC | PRN

## 2014-04-21 NOTE — Progress Notes (Signed)
Pre-visit discussion using our clinic review tool. No additional management support is needed unless otherwise documented below in the visit note.  

## 2014-04-21 NOTE — Progress Notes (Signed)
Patient ID: Amy House, female   DOB: [birth year], 48 y.o.   MRN: [id number]  Patient Active Problem List   Diagnosis Date Noted  . Encounter for screening colonoscopy 03/12/2014  . Urinary frequency 04/01/2013  . Hypertension 12/12/2011  . Morbid obesity with BMI of 45.0-49.9, adult 12/12/2011  . Impaired glucose tolerance 12/12/2011  . Hyperlipidemia     Subjective:  CC:   Chief Complaint  Patient presents with  . Annual Exam    Patient needs BP med refilled.    HPI:   Amy House is a 48 y.o. female who presents for  Follow up on chronic conditions including hypertension, impaired glucose tolerance , hyperlipidemia and morbid obesity Body mass index is 47.25 kg/(m^2).    Had PAP smear  Done by  Dr Annamarie MajorPaul Harris and [facility]. She had  her colonoscopy by Doristine CounterBurnett  Last month,  normal .  Cc:  Obesity.  Dr Tiburcio PeaHarris has prescribed her an anorexiant ,diethylpropion ER but she has not started it yet due to concerns about its safety. She has lost 3 lbs in the past month without the medication by starting a daily walking program. She is averaging 2 miles daily on a treadmill at [employer] daily , started in  June with an exercise buddy. She denies chest pain,  Has measured her pulse during walking and it is around 120. She may have had a prior trial of phentermine which was tolerated but not effective, but the records are not available.   Past Medical History  Diagnosis Date  . Bradycardia   . Hyperlipidemia   . Hypertension   . Obesity (BMI 30-39.9)     Past Surgical History  Procedure Laterality Date  . Abdominal hysterectomy    . Tonsillectomy  1979       The following portions of the patient's history were reviewed and updated as appropriate: Allergies, current medications, and problem list.    Review of Systems:   Patient denies headache, fevers, malaise, unintentional weight loss, skin rash, eye pain, sinus congestion and  sinus pain, sore throat, dysphagia,  hemoptysis , cough, dyspnea, wheezing, chest pain, palpitations, orthopnea, edema, abdominal pain, nausea, melena, diarrhea, constipation, flank pain, dysuria, hematuria, urinary  Frequency, nocturia, numbness, tingling, seizures,  Focal weakness, Loss of consciousness,  Tremor, insomnia, depression, anxiety, and suicidal ideation.     History   Social History  . Marital Status: Married    Spouse Name: N/A    Number of Children: N/A  . Years of Education: N/A   Occupational History  . Not on file.   Social History Main Topics  . Smoking status: Former Smoker -- 0.50 packs/day    Types: Cigarettes    Quit date: 04/01/1993  . Smokeless tobacco: Never Used  . Alcohol Use: No  . Drug Use: No  . Sexual Activity: Not on file   Other Topics Concern  . Not on file   Social History Narrative  . No narrative on file    Objective:  Filed Vitals:   04/21/14 1636  BP: 142/90  Pulse: 92  Temp: 98.4 F (36.9 C)  Resp: 18     General appearance: alert, cooperative and appears stated age Ears: normal TM's and external ear canals both ears Throat: lips, mucosa, and tongue normal; teeth and gums normal Neck: no adenopathy, no carotid bruit, supple, symmetrical, trachea midline and thyroid not enlarged, symmetric, no tenderness/mass/nodules Back: symmetric, no curvature. ROM normal.  No CVA tenderness. Lungs: clear to auscultation bilaterally Heart: regular rate and rhythm, S1, S2 normal, no murmur, click, rub or gallop Abdomen: soft, non-tender; bowel sounds normal; no masses,  no organomegaly Pulses: 2+ and symmetric Skin: Skin color, texture, turgor normal. No rashes or lesions Lymph nodes: Cervical, supraclavicular, and axillary nodes normal.  Assessment and Plan:  Hypertension Elevated today , with repeat mearement by me 150/100.  Medication change today : stopping lisinopril  Starting losartan/hct 50.12.5  ,  Continue amlodipine.  Lab  Results  Component Value Date   CREATININE 0.8 04/17/2014   Lab Results  Component Value Date   NA 138 04/17/2014   K 3.6 04/17/2014   CL 101 04/17/2014   CO2 26 04/17/2014     Impaired glucose tolerance Prevention of diabetes advised with Low GI diet and wt loss with regular exercise advised   Lab Results  Component Value Date   HGBA1C 5.7 04/17/2014      Hyperlipidemia Mild, LDL < 130,  HDL low.  Will reassess in 6 months after Mediterranean style diet employed.,   Lab Results  Component Value Date   CHOL 172 04/17/2014   HDL 37.10* 04/17/2014   LDLCALC 114* 04/17/2014   TRIG 104.0 04/17/2014   CHOLHDL 5 04/17/2014       Morbid obesity with BMI of 45.0-49.9, adult She has had difficulty losing weight due to increased appetite and is requesting my opinion on use of anorexiant prescribed by Dr.  Tiburcio Pea..  She is aware of the possible side effects and risks and understands that    The medication will be discontinued if she has not lost 5% of her body weight over the next 3 months, which , based on today's weight is 14 lbs.   Updated Medication List Outpatient Encounter Prescriptions as of 04/21/2014  Medication Sig  . amLODipine (NORVASC) 10 MG tablet Take 1 tablet (10 mg total) by mouth daily.  Marland Kitchen aspirin 81 MG tablet Take 81 mg by mouth daily.    . polyethylene glycol powder (GLYCOLAX/MIRALAX) powder 255 grams one bottle for colonoscopy prep  . valACYclovir (VALTREX) 500 MG tablet   . [DISCONTINUED] lisinopril (PRINIVIL,ZESTRIL) 40 MG tablet Take 1 tablet (40 mg total) by mouth daily.  Marland Kitchen ALPRAZolam (XANAX) 0.25 MG tablet Take 1 tablet (0.25 mg total) by mouth at bedtime as needed for anxiety or sleep.  . Diethylpropion HCl CR 75 MG TB24 Take 1 tablet by mouth daily.  Marland Kitchen losartan-hydrochlorothiazide (HYZAAR) 50-12.5 MG per tablet Take 1 tablet by mouth daily.     Orders Placed This Encounter  Procedures  . HM COLONOSCOPY    No Follow-up on file.

## 2014-04-21 NOTE — Patient Instructions (Addendum)
Your bp was 150/100.    Goal is 130/80 or less .  Make sure you check it one more time before  You start the medication  For appetite.  I am changing lisinopril to losartan/hct  For better bp control.  Take it in the AM along with amlodipine   Alprazolam is a short acting anxiety medication to help you relax enough to stop thining and go to sleep!! It is a controlled substance so do not share with others    This is  One version of a  "Low GI"  Diet:  It will still lower your blood sugars and allow you to lose 4 to 8  lbs  per month if you follow it carefully.  Your goal with exercise is a minimum of 30 minutes of aerobic exercise 5 days per week (Walking does not count once it becomes easy!)    All of the foods can be found at grocery stores and in bulk at [employer].  The Atkins protein bars and shakes are available in more varieties at Target, WalMart and [employer]'s Foods.     7 AM Breakfast:  Choose from the following:  Low carbohydrate Protein  Shakes (I recommend the EAS AdvantEdge "Carb Control" shakes  Or the low carb shakes by Atkins.    2.5 carbs   Arnold's "Sandwhich Thin"toasted  w/ peanut butter (no jelly: about 20 net carbs  "Bagel Thin" with cream cheese and salmon: about 20 carbs   a scrambled egg/bacon/cheese burrito made with Mission's "carb balance" whole wheat tortilla  (about 10 net carbs )   Avoid cereal and bananas, oatmeal and cream of wheat and grits. They are loaded with carbohydrates!   10 AM: high protein snack  Protein bar by Atkins (the snack size, under 200 cal, usually < 6 net carbs).    A stick of cheese:  Around 1 carb,  100 cal     Dannon Light n Fit [place] Yogurt  (80 cal, 8 carbs)  Other so called "protein bars" and Greek yogurts tend to be loaded with carbohydrates.  Remember, in food advertising, the word "energy" is synonymous for " carbohydrate."  Lunch:   A Sandwich using the bread choices listed, Can use any  Eggs,  lunchmeat, grilled meat or canned  tuna), avocado, regular mayo/mustard  and cheese.  A Salad using blue cheese, ranch,  Goddess or vinagrette,  No croutons or "confetti" and no "candied nuts" but regular nuts OK.   No pretzels or chips.  Pickles and miniature sweet peppers are a good low carb alternative that provide a "crunch"  The bread is the only source of carbohydrate in a sandwich and  can be decreased by trying some of these alternatives to traditional loaf bread  Joseph's makes a pita bread and a flat bread that are 50 cal and 4 net carbs available at BJs and WalMart.  This can be toasted to use with hummous as well  Toufayan makes a low carb flatbread that's 100 cal and 9 net carbs available at [employer] and [employer]  Mission makes 2 sizes of  Low carb whole wheat tortilla  (The large one is 210 cal and 6 net carbs) Avoid "Low fat dressings, as well as Reyne DumasCatalina and [address] [place] [place] dressings They are loaded with sugar!   3 PM/ Mid day  Snack:  Consider  1 ounce of  almonds, walnuts, pistachios, pecans, peanuts,  Macadamia nuts or a nut medley.  Avoid "granola"; the  dried cranberries and raisins are loaded with carbohydrates. Mixed nuts as long as there are no raisins,  cranberries or dried fruit.    Try the prosciutto/mozzarella cheese sticks by Fiorruci  In deli /backery section   High protein      6 PM  Dinner:     Meat/fowl/fish with a green salad, and either broccoli, cauliflower, green beans, spinach, brussel sprouts or  Lima beans. DO NOT BREAD THE PROTEIN!!      There is a low carb pasta by Dreamfield's that is acceptable and tastes great: only 5 digestible carbs/serving.( All grocery stores but BJs carry it )  Try Kai Levins Angelo's chicken piccata or chicken or eggplant parm over low carb pasta.(Lowes and BJs)   Clifton Custard Sanchez's "Carnitas" (pulled pork, no sauce,  0 carbs) or his beef pot roast to make a dinner burrito (at BJ's)  Pesto over low carb pasta (bj's sells a good quality pesto in the center refrigerated  section of the deli   Try satueeing  Roosvelt Harps with mushroooms  Whole wheat pasta is still full of digestible carbs and  Not as low in glycemic index as Dreamfield's.   Brown rice is still rice,  So skip the rice and noodles if you eat [place] or [place] (or at least limit to 1/2 cup)  9 PM snack :   Breyer's "low carb" fudgsicle or  ice cream bar (Carb Smart line), or  Weight Watcher's ice cream bar , or another "no sugar added" ice cream;  a serving of fresh berries/cherries with whipped cream   Cheese or DANNON'S LlGHT N FIT GREEK YOGURT  8 ounces of [place] unsweetened almond/cococunut milk    Avoid bananas, pineapple, grapes  and watermelon on a regular basis because they are high in sugar.  THINK OF THEM AS DESSERT  Remember that snack Substitutions should be less than 10 NET carbs per serving and meals < 20 carbs. Remember to subtract fiber grams to get the "net carbs."

## 2014-04-22 NOTE — Assessment & Plan Note (Addendum)
Elevated today , with repeat mearement by me 150/100.  Medication change today : stopping lisinopril  Starting losartan/hct 50.12.5  ,  Continue amlodipine.  Lab Results  Component Value Date   CREATININE 0.8 04/17/2014   Lab Results  Component Value Date   NA 138 04/17/2014   K 3.6 04/17/2014   CL 101 04/17/2014   CO2 26 04/17/2014

## 2014-04-22 NOTE — Assessment & Plan Note (Signed)
She has had difficulty losing weight due to increased appetite and is requesting my opinion on use of anorexiant prescribed by Dr.  Tiburcio PeaHarris..  She is aware of the possible side effects and risks and understands that    The medication will be discontinued if she has not lost 5% of her body weight over the next 3 months, which , based on today's weight is 14 lbs.

## 2014-04-22 NOTE — Assessment & Plan Note (Signed)
Mild, LDL < 130,  HDL low.  Will reassess in 6 months after Mediterranean style diet employed.,   Lab Results  Component Value Date   CHOL 172 04/17/2014   HDL 37.10* 04/17/2014   LDLCALC 114* 04/17/2014   TRIG 104.0 04/17/2014   CHOLHDL 5 04/17/2014

## 2014-04-22 NOTE — Assessment & Plan Note (Signed)
Prevention of diabetes advised with Low GI diet and wt loss with regular exercise advised   Lab Results  Component Value Date   HGBA1C 5.7 04/17/2014

## 2014-05-21 ENCOUNTER — Other Ambulatory Visit: Payer: Self-pay | Admitting: Internal Medicine

## 2014-05-29 ENCOUNTER — Ambulatory Visit (INDEPENDENT_AMBULATORY_CARE_PROVIDER_SITE_OTHER): Payer: BC Managed Care – PPO | Admitting: Internal Medicine

## 2014-05-29 ENCOUNTER — Encounter: Payer: Self-pay | Admitting: Internal Medicine

## 2014-05-29 VITALS — BP 138/84 | HR 71 | Temp 98.2°F | Resp 14 | Ht 65.75 in | Wt 283.2 lb

## 2014-05-29 DIAGNOSIS — Z6841 Body Mass Index (BMI) 40.0 and over, adult: Secondary | ICD-10-CM

## 2014-05-29 MED ORDER — DIETHYLPROPION HCL ER 75 MG PO TB24: 1.0000 | ORAL_TABLET | Freq: Every day | ORAL | Status: DC

## 2014-05-29 NOTE — Progress Notes (Signed)
Pre-visit discussion using our clinic review tool. No additional management support is needed unless otherwise documented below in the visit note.  

## 2014-05-29 NOTE — Patient Instructions (Signed)
You are doing well!!  You've lost 7 lbs in one month.  That is a very safe rate of loss   We will continue the wt loss medication for another 3 months and see you in December

## 2014-05-31 ENCOUNTER — Encounter: Payer: Self-pay | Admitting: Internal Medicine

## 2014-05-31 NOTE — Progress Notes (Signed)
Patient ID: Amy House, female   DOB: 1965/10/21, 48 y.o.   MRN: [id number]   Patient Active Problem List   Diagnosis Date Noted  . Encounter for screening colonoscopy 03/12/2014  . Urinary frequency 04/01/2013  . Hypertension 12/12/2011  . Morbid obesity with BMI of 45.0-49.9, adult 12/12/2011  . Impaired glucose tolerance 12/12/2011  . Hyperlipidemia     Subjective:  CC:   Chief Complaint  Patient presents with  . Follow-up    medication refills    HPI:   Amy House is a 48 y.o. female who presents for one month follow up on initiation of diethylproprion,  She is tolerating the medication without side effects and has lost 7 lbs in the last 4 weeks.  Following a low glycemic index diet,    Past Medical History  Diagnosis Date  . Bradycardia   . Hyperlipidemia   . Hypertension   . Obesity (BMI 30-39.9)     Past Surgical History  Procedure Laterality Date  . Abdominal hysterectomy    . Tonsillectomy  1979       The following portions of the patient's history were reviewed and updated as appropriate: Allergies, current medications, and problem list.    Review of Systems:   Patient denies headache, fevers, malaise, unintentional weight loss, skin rash, eye pain, sinus congestion and sinus pain, sore throat, dysphagia,  hemoptysis , cough, dyspnea, wheezing, chest pain, palpitations, orthopnea, edema, abdominal pain, nausea, melena, diarrhea, constipation, flank pain, dysuria, hematuria, urinary  Frequency, nocturia, numbness, tingling, seizures,  Focal weakness, Loss of consciousness,  Tremor, insomnia, depression, anxiety, and suicidal ideation.     History   Social History  . Marital Status: Married    Spouse Name: N/A    Number of Children: N/A  . Years of Education: N/A   Occupational History  . Not on file.   Social History Main Topics  . Smoking status: Former Smoker -- 0.50 packs/day    Types: Cigarettes    Quit date: 04/01/1993  .  Smokeless tobacco: Never Used  . Alcohol Use: No  . Drug Use: No  . Sexual Activity: Not on file   Other Topics Concern  . Not on file   Social History Narrative  . No narrative on file    Objective:  Filed Vitals:   05/29/14 1453  BP: 138/84  Pulse: 71  Temp: 98.2 F (36.8 C)  Resp: 14     General appearance: alert, cooperative and appears stated age Neck: no adenopathy, no carotid bruit, supple, symmetrical, trachea midline and thyroid not enlarged, symmetric, no tenderness/mass/nodules Back: symmetric, no curvature. ROM normal. No CVA tenderness. Lungs: clear to auscultation bilaterally Heart: regular rate and rhythm, S1, S2 normal, no murmur, click, rub or gallop Abdomen: soft, non-tender; bowel sounds normal; no masses,  no organomegaly   Assessment and Plan:  Morbid obesity with BMI of 45.0-49.9, adult She has lost 7 lbs in one month using pharmacologically assisted  appetite suppression with diethylpropion .  Vital signs are normal and she is tolerating medication well.  Return in 3 months    Updated Medication List Outpatient Encounter Prescriptions as of 05/29/2014  Medication Sig  . ALPRAZolam (XANAX) 0.25 MG tablet Take 1 tablet (0.25 mg total) by mouth at bedtime as needed for anxiety or sleep.  Marland Kitchen amLODipine (NORVASC) 10 MG tablet Take 1 tablet (10 mg total) by mouth daily.  Marland Kitchen aspirin 81 MG tablet Take 81 mg by mouth daily.    Marland Kitchen  Diethylpropion HCl CR 75 MG TB24 Take 1 tablet (75 mg total) by mouth daily.  Marland Kitchen losartan-hydrochlorothiazide (HYZAAR) 50-12.5 MG per tablet Take 1 tablet by mouth daily.  . polyethylene glycol powder (GLYCOLAX/MIRALAX) powder 255 grams one bottle for colonoscopy prep  . valACYclovir (VALTREX) 500 MG tablet   . [DISCONTINUED] Diethylpropion HCl CR 75 MG TB24 Take 1 tablet by mouth daily.     No orders of the defined types were placed in this encounter.    No Follow-up on file.

## 2014-05-31 NOTE — Assessment & Plan Note (Addendum)
She has lost 7 lbs in one month using pharmacologically assisted  appetite suppression with diethylpropion .  Vital signs are normal and she is tolerating medication well.  Return in 3 months

## 2015-01-12 ENCOUNTER — Other Ambulatory Visit: Payer: Self-pay | Admitting: *Deleted

## 2015-01-12 DIAGNOSIS — Z1231 Encounter for screening mammogram for malignant neoplasm of breast: Secondary | ICD-10-CM

## 2015-01-28 ENCOUNTER — Ambulatory Visit: Payer: Self-pay | Attending: Family Medicine

## 2015-02-24 ENCOUNTER — Ambulatory Visit: Payer: Self-pay | Admitting: Internal Medicine

## 2015-04-15 ENCOUNTER — Telehealth: Payer: Self-pay | Admitting: *Deleted

## 2015-04-15 DIAGNOSIS — E119 Type 2 diabetes mellitus without complications: Secondary | ICD-10-CM

## 2015-04-15 NOTE — Telephone Encounter (Signed)
Patient called about a lab appt before her CPE on 04/26/15. I didn't see any future lab orders. Can you see if Dr. Darrick Huntsman wants labs before her appt. Please advise MD.

## 2015-04-15 NOTE — Telephone Encounter (Signed)
Thank you Olegario Messier, I have signed the orders

## 2015-04-15 NOTE — Telephone Encounter (Signed)
I pended labs what DX?

## 2015-04-16 NOTE — Telephone Encounter (Signed)
Please schedule lab

## 2015-04-16 NOTE — Telephone Encounter (Signed)
Pt lab appt scheduled 

## 2015-04-20 ENCOUNTER — Other Ambulatory Visit (INDEPENDENT_AMBULATORY_CARE_PROVIDER_SITE_OTHER): Payer: BC Managed Care – PPO

## 2015-04-20 DIAGNOSIS — E876 Hypokalemia: Secondary | ICD-10-CM

## 2015-04-20 DIAGNOSIS — E119 Type 2 diabetes mellitus without complications: Secondary | ICD-10-CM | POA: Diagnosis not present

## 2015-04-20 LAB — CBC WITH DIFFERENTIAL/PLATELET
BASOS ABS: 0.1 10*3/uL (ref 0.0–0.1)
Basophils Relative: 0.7 % (ref 0.0–3.0)
Eosinophils Absolute: 0.2 10*3/uL (ref 0.0–0.7)
Eosinophils Relative: 2 % (ref 0.0–5.0)
HCT: 38.4 % (ref 36.0–46.0)
Hemoglobin: 12.6 g/dL (ref 12.0–15.0)
LYMPHS PCT: 29.9 % (ref 12.0–46.0)
Lymphs Abs: 2.3 10*3/uL (ref 0.7–4.0)
MCHC: 32.8 g/dL (ref 30.0–36.0)
MCV: 79.9 fl (ref 78.0–100.0)
Monocytes Absolute: 0.4 10*3/uL (ref 0.1–1.0)
Monocytes Relative: 5 % (ref 3.0–12.0)
NEUTROS PCT: 62.4 % (ref 43.0–77.0)
Neutro Abs: 4.8 10*3/uL (ref 1.4–7.7)
PLATELETS: 212 10*3/uL (ref 150.0–400.0)
RBC: 4.8 Mil/uL (ref 3.87–5.11)
RDW: 16.3 % — AB (ref 11.5–15.5)
WBC: 7.7 10*3/uL (ref 4.0–10.5)

## 2015-04-20 LAB — COMPREHENSIVE METABOLIC PANEL
ALBUMIN: 4 g/dL (ref 3.5–5.2)
ALT: 14 U/L (ref 0–35)
AST: 18 U/L (ref 0–37)
Alkaline Phosphatase: 96 U/L (ref 39–117)
BUN: 16 mg/dL (ref 6–23)
CALCIUM: 9.2 mg/dL (ref 8.4–10.5)
CO2: 28 mEq/L (ref 19–32)
Chloride: 102 mEq/L (ref 96–112)
Creatinine, Ser: 0.71 mg/dL (ref 0.40–1.20)
GFR: 111.43 mL/min (ref >60.00)
Glucose, Bld: 81 mg/dL (ref 70–99)
Potassium: 3.2 mEq/L — ABNORMAL LOW (ref 3.5–5.1)
Sodium: 137 mEq/L (ref 135–145)
Total Bilirubin: 0.6 mg/dL (ref 0.2–1.2)
Total Protein: 7.4 g/dL (ref 6.0–8.3)

## 2015-04-20 LAB — MICROALBUMIN / CREATININE URINE RATIO
CREATININE, U: 89.2 mg/dL
MICROALB UR: 2.2 mg/dL — AB (ref 0.0–1.9)
MICROALB/CREAT RATIO: 2.5 mg/g (ref 0.0–30.0)

## 2015-04-20 LAB — LIPID PANEL
CHOL/HDL RATIO: 4
Cholesterol: 174 mg/dL (ref 0–200)
HDL: 38.8 mg/dL — AB (ref >39.00)
LDL Cholesterol: 117 mg/dL — ABNORMAL HIGH (ref 0–99)
NonHDL: 134.93
Triglycerides: 89 mg/dL (ref 0.0–149.0)
VLDL: 17.8 mg/dL (ref 0.0–40.0)

## 2015-04-20 LAB — HM MAMMOGRAPHY

## 2015-04-20 LAB — HEMOGLOBIN A1C: Hgb A1c MFr Bld: 5.7 % (ref 4.6–6.5)

## 2015-04-23 ENCOUNTER — Encounter: Payer: Self-pay | Admitting: Internal Medicine

## 2015-04-23 DIAGNOSIS — E876 Hypokalemia: Secondary | ICD-10-CM | POA: Insufficient documentation

## 2015-04-23 MED ORDER — POTASSIUM CHLORIDE CRYS ER 20 MEQ PO TBCR: 20.0000 meq | EXTENDED_RELEASE_TABLET | Freq: Every day | ORAL | Status: DC

## 2015-04-23 NOTE — Addendum Note (Signed)
Addended by: Sherlene Shams on: 04/23/2015 04:02 PM   Modules accepted: Orders

## 2015-04-26 ENCOUNTER — Ambulatory Visit (INDEPENDENT_AMBULATORY_CARE_PROVIDER_SITE_OTHER): Payer: BC Managed Care – PPO | Admitting: Internal Medicine

## 2015-04-26 ENCOUNTER — Encounter: Payer: Self-pay | Admitting: Internal Medicine

## 2015-04-26 VITALS — BP 128/88 | HR 82 | Temp 98.9°F | Resp 16 | Ht 66.0 in | Wt 285.5 lb

## 2015-04-26 DIAGNOSIS — R7302 Impaired glucose tolerance (oral): Secondary | ICD-10-CM | POA: Diagnosis not present

## 2015-04-26 DIAGNOSIS — E876 Hypokalemia: Secondary | ICD-10-CM

## 2015-04-26 DIAGNOSIS — Z6841 Body Mass Index (BMI) 40.0 and over, adult: Secondary | ICD-10-CM

## 2015-04-26 DIAGNOSIS — I10 Essential (primary) hypertension: Secondary | ICD-10-CM | POA: Diagnosis not present

## 2015-04-26 DIAGNOSIS — Z90710 Acquired absence of both cervix and uterus: Secondary | ICD-10-CM | POA: Diagnosis not present

## 2015-04-26 DIAGNOSIS — Z0000 Encounter for general adult medical examination without abnormal findings: Secondary | ICD-10-CM

## 2015-04-26 DIAGNOSIS — E785 Hyperlipidemia, unspecified: Secondary | ICD-10-CM

## 2015-04-26 DIAGNOSIS — Z23 Encounter for immunization: Secondary | ICD-10-CM | POA: Diagnosis not present

## 2015-04-26 MED ORDER — CITALOPRAM HYDROBROMIDE 10 MG PO TABS: 10.0000 mg | ORAL_TABLET | Freq: Every day | ORAL | Status: DC

## 2015-04-26 MED ORDER — CYANOCOBALAMIN 1000 MCG/ML IJ SOLN
1000.0000 ug | Freq: Once | INTRAMUSCULAR | Status: AC
  Administered 2015-04-26: 1000 ug via INTRAMUSCULAR

## 2015-04-26 MED ORDER — CYANOCOBALAMIN 1000 MCG/ML IJ SOLN: INTRAMUSCULAR | Status: DC

## 2015-04-26 MED ORDER — SYRINGE (DISPOSABLE) 1 ML MISC: Status: DC

## 2015-04-26 MED ORDER — LOSARTAN POTASSIUM 100 MG PO TABS: 100.0000 mg | ORAL_TABLET | Freq: Every day | ORAL | Status: DC

## 2015-04-26 MED ORDER — CYANOCOBALAMIN 1000 MCG SL SUBL: 1.0000 | SUBLINGUAL_TABLET | Freq: Every day | SUBLINGUAL | Status: DC

## 2015-04-26 NOTE — Progress Notes (Signed)
Patient ID: Amy House, female    DOB: [birth year]  Age: 49 y.o. MRN: [id number]  The patient is here for annual preventive examination and management of other chronic and acute problems.   Her screening mammogram was reported as "incomplete mammogram on the left" and she has been scheduled for  Additional  images by [facility] this  Friday .    The risk factors are reflected in the social history.    The roster of all physicians providing medical care to patient - is listed in the Snapshot section of the chart.  Activities of daily living:  The patient is 100% independent in all ADLs: dressing, toileting, feeding as well as independent mobility  Home safety : The patient has smoke detectors in the home. They wear seatbelts.  There are no firearms at home. There is no violence in the home.   There is no risks for hepatitis, STDs or HIV. There is no   history of blood transfusion. They have no travel history to infectious disease endemic areas of the world.  The patient has seen their dentist in the last six month. They have seen their eye doctor in the last year. They admit to slight hearing difficulty with regard to whispered voices and some television programs.  They have deferred audiologic testing in the last year.  They do not  have excessive sun exposure. Discussed the need for sun protection: hats, long sleeves and use of sunscreen if there is significant sun exposure.   Diet: the importance of a healthy diet is discussed. They do have a healthy diet.   Depression screen: there are no signs or vegative symptoms of depression- irritability, change in appetite, anhedonia, sadness/tearfullness.  Cognitive assessment: the patient manages all their financial and personal affairs and is actively engaged. They could relate day,date,year and events; recalled 2/3 objects at 3 minutes; performed clock-face test normally.  The following portions of the patient's history were reviewed  and updated as appropriate: allergies, current medications, past family history, past medical history,  past surgical history, past social history  and problem list.  Visual acuity was not assessed per patient preference since she has regular follow up with her ophthalmologist. Hearing and body mass index were assessed and reviewed.   During the course of the visit the patient was educated and counseled about appropriate screening and preventive services including : fall prevention , diabetes screening, nutrition counseling, colorectal cancer screening, and recommended immunizations.    CC: The primary encounter diagnosis was S/P abdominal hysterectomy. Diagnoses of Morbid obesity with BMI of 45.0-49.9, adult, Hypokalemia, Essential hypertension, Need for prophylactic vaccination with combined diphtheria-tetanus-pertussis (DTP) vaccine, Impaired glucose tolerance, Hyperlipidemia, and Encounter for preventive health examination were also pertinent to this visit. Morbid obesity.   She has discontinued the appetite suppressant due to persistent mood changes which have improved since she stopped the medication. She would like an alternative as she has gained back 2 lbs.  She is exercising an average of 3 times per week.   Patient is taking her medications as prescribed and notes no adverse effects.  Home BP readings have been done about once per week and are  generally < 130/80 .  She is avoiding added salt in her diet and walkingabout 3 times per week for exercise  .  History Amy House has a past medical history of Bradycardia; Hyperlipidemia; Hypertension; and Obesity (BMI 30-39.9).   She has past surgical history that includes Abdominal hysterectomy and Tonsillectomy (1979).  Her family history includes Diabetes in her mother.She reports that she quit smoking about 22 years ago. Her smoking use included Cigarettes. She smoked 0.50 packs per day. She has never used smokeless tobacco. She reports that  she does not drink alcohol or use illicit drugs.  Outpatient Prescriptions Prior to Visit  Medication Sig Dispense Refill  . aspirin 81 MG tablet Take 81 mg by mouth daily.      . potassium chloride SA (K-DUR,KLOR-CON) 20 MEQ tablet Take 1 tablet (20 mEq total) by mouth daily. 30 tablet 3  . valACYclovir (VALTREX) 500 MG tablet     . losartan-hydrochlorothiazide (HYZAAR) 50-12.5 MG per tablet Take 1 tablet by mouth daily. 90 tablet 3  . ALPRAZolam (XANAX) 0.25 MG tablet Take 1 tablet (0.25 mg total) by mouth at bedtime as needed for anxiety or sleep. (Patient not taking: Reported on 04/26/2015) 30 tablet 3  . amLODipine (NORVASC) 10 MG tablet Take 1 tablet (10 mg total) by mouth daily. (Patient not taking: Reported on 04/26/2015) 90 tablet 3  . Diethylpropion HCl CR 75 MG TB24 Take 1 tablet (75 mg total) by mouth daily. (Patient not taking: Reported on 04/26/2015) 30 tablet 2  . polyethylene glycol powder (GLYCOLAX/MIRALAX) powder 255 grams one bottle for colonoscopy prep 255 g 0   No facility-administered medications prior to visit.    Review of Systems   Patient denies headache, fevers, malaise, unintentional weight loss, skin rash, eye pain, sinus congestion and sinus pain, sore throat, dysphagia,  hemoptysis , cough, dyspnea, wheezing, chest pain, palpitations, orthopnea, edema, abdominal pain, nausea, melena, diarrhea, constipation, flank pain, dysuria, hematuria, urinary  Frequency, nocturia, numbness, tingling, seizures,  Focal weakness, Loss of consciousness,  Tremor, insomnia, depression, anxiety, and suicidal ideation.      Objective:  BP 128/88 mmHg  Pulse 82  Temp(Src) 98.9 F (37.2 C) (Oral)  Resp 16  Ht 5\' 6"  (1.676 m)  Wt 285 lb 8 oz (129.502 kg)  BMI 46.10 kg/m2  SpO2 96%  Physical Exam   General appearance: alert, cooperative and appears stated age Head: Normocephalic, without obvious abnormality, atraumatic Eyes: conjunctivae/corneas clear. PERRL, EOM's intact.  Fundi benign. Ears: normal TM's and external ear canals both ears Nose: Nares normal. Septum midline. Mucosa normal. No drainage or sinus tenderness. Throat: lips, mucosa, and tongue normal; teeth and gums normal Neck: no adenopathy, no carotid bruit, no JVD, supple, symmetrical, trachea midline and thyroid not enlarged, symmetric, no tenderness/mass/nodules Lungs: clear to auscultation bilaterally Breasts: pendulous, increased thickness noted on left lateral breast upper outer quadrant, no discrete masses.   Heart: regular rate and rhythm, S1, S2 normal, no murmur, click, rub or gallop Abdomen: soft, non-tender; bowel sounds normal; no masses,  no organomegaly Extremities: extremities normal, atraumatic, no cyanosis or edema Pulses: 2+ and symmetric Skin: Skin color, texture, turgor normal. No rashes or lesions Neurologic: Alert and oriented X 3, normal strength and tone. Normal symmetric reflexes. Normal coordination and gait.     Assessment & Plan:   Problem List Items Addressed This Visit      Unprioritized   Hypertension    Elevated today,  And recent labs notable for hypokalemia.  Changing osartan hct to losartan 100 mg daily due to hypokalemia.      Relevant Medications   losartan (COZAAR) 100 MG tablet   Morbid obesity with BMI of 45.0-49.9, adult    She stopped the medication 2 months ago due to mood changes,  Started feeling depressed. Phentermine also not  tolerated.   Discussed trial of citalopram and B12 supplementation with monthly or weekly if needed injections.  Instructions and demonstration given by staff,  rx sent to pharmacy.  Return in 3 months.       Relevant Medications   cyanocobalamin ((VITAMIN B-12)) injection 1,000 mcg (Completed)   Hyperlipidemia    Mild, LDL < 130,  HDL low but improved.  Continue reassesment in 6 months and continue Mediterranean style diet   Lab Results  Component Value Date   CHOL 174 04/20/2015   HDL 38.80* 04/20/2015   LDLCALC  117* 04/20/2015   TRIG 89.0 04/20/2015   CHOLHDL 4 04/20/2015             Relevant Medications   losartan (COZAAR) 100 MG tablet   Impaired glucose tolerance    She continues to have normal A1c.  Prevention of diabetes advised with Low GI diet and wt loss with regular exercise  Lab Results  Component Value Date   HGBA1C 5.7 04/20/2015            Encounter for preventive health examination    Annual comprehensive preventive exam was done as well as an evaluation and management of acute and chronic conditions .  During the course of the visit the patient was educated and counseled about appropriate screening and preventive services including :  diabetes screening, lipid analysis with projected  10 year  risk for CAD , nutrition counseling, colorectal cancer screening, and recommended immunizations.  Printed recommendations for health maintenance screenings was given.       Hypokalemia    Secondary to use of hctz  .  Will supplement,  Then change PB mes to losartan only;  100 mg       S/P abdominal hysterectomy - Primary    Other Visit Diagnoses    Need for prophylactic vaccination with combined diphtheria-tetanus-pertussis (DTP) vaccine        Relevant Orders    Tdap vaccine greater than or equal to 7yo IM (Completed)       I have discontinued Ms. Spearman polyethylene glycol powder, ALPRAZolam, losartan-hydrochlorothiazide, amLODipine, Diethylpropion HCl CR, and Cyanocobalamin. I am also having her start on losartan, citalopram, cyanocobalamin, and Syringe (Disposable). Additionally, I am having her maintain her aspirin, valACYclovir, and potassium chloride SA. We administered cyanocobalamin.  Meds ordered this encounter  Medications  . losartan (COZAAR) 100 MG tablet    Sig: Take 1 tablet (100 mg total) by mouth daily.    Dispense:  90 tablet    Refill:  3    KEEP ON FILE FOR NEXT REFILL NOTE HCTZ HAS BEEN STOPPED AND LOSARTAN INCREASED  . citalopram (CELEXA) 10  MG tablet    Sig: Take 1 tablet (10 mg total) by mouth daily.    Dispense:  90 tablet    Refill:  1  . DISCONTD: Cyanocobalamin 1000 MCG SUBL    Sig: Place 1 tablet (1,000 mcg total) under the tongue daily.    Dispense:  90 tablet    Refill:  3  . cyanocobalamin (,VITAMIN B-12,) 1000 MCG/ML injection    Sig: For use with monthly B12 injections    Dispense:  10 mL    Refill:  2  . Syringe, Disposable, 1 ML MISC    Sig: FOR USE WITH B12 INJECTION S    Dispense:  25 each    Refill:  0  . cyanocobalamin ((VITAMIN B-12)) injection 1,000 mcg    Sig:     Medications  Discontinued During This Encounter  Medication Reason  . polyethylene glycol powder (GLYCOLAX/MIRALAX) powder Completed Course  . amLODipine (NORVASC) 10 MG tablet   . ALPRAZolam (XANAX) 0.25 MG tablet   . Diethylpropion HCl CR 75 MG TB24   . losartan-hydrochlorothiazide (HYZAAR) 50-12.5 MG per tablet   . Cyanocobalamin 1000 MCG SUBL     Follow-up: Return in about 3 months (around 07/27/2015).   Sherlene Shams, MD

## 2015-04-26 NOTE — Patient Instructions (Signed)
1) START THE POTASSIUM SUPPLEMENT DAILY.  Discontinue when  you stop the lisinopril/hct pill and start the losartan 100 mg pill for blood pressure   2) start the citalopram at bedtime at 1/2 tablet daily .  increase to full tablet after a week  3) You can give yourself a B12 injection once a week as part of your diet .  The  diet I discussed with you today is the 10 day Green Smoothie Cleansing /Detox Diet by Linden Dolin . available on [address] for around $10, and at [employer]  This is not a low carb or a weight loss diet,  It is fundamentally a "cleansing" low fat diet that eliminates sugar, gluten, caffeine, alcohol and dairy for 10 days .  What you add back after the initial ten days is entirely up to  you!  You can expect to lose 5 to 10 lbs depending on how strict you are.   I suggest drinking 2 smoothies daily and keeping one chewable meal (but keep it simple, like baked fish and salad, rice or bok choy) .  You snack primarily on fresh  fruit, egg whites and judicious quantities of nuts. You can add vegetable based protein powder (nothing with whey , since whey is dairy) in it.  [employer] has a [employer], political party .  It does require some form of a nutrient extractor (Vita Mix, a electric juicer,  Or a Nutribullet Rx).  i have found that using frozen fruits is much more convenient and cost effective. You can even find plenty of organic fruit in the frozen fruit section of [employer].  Just thaw what you need for the following day the night before in the refrigerator (to avoid jamming up your machine)   Health Maintenance Adopting a healthy lifestyle and getting preventive care can go a long way to promote health and wellness. Talk with your health care provider about what schedule of regular examinations is right for you. This is a good chance for you to check in with your provider about disease prevention and staying healthy. In between checkups, there are plenty of things you can do on your own. Experts have  done a lot of research about which lifestyle changes and preventive measures are most likely to keep you healthy. Ask your health care provider for more information. WEIGHT AND DIET  Eat a healthy diet  Be sure to include plenty of vegetables, fruits, low-fat dairy products, and lean protein.  Do not eat a lot of foods high in solid fats, added sugars, or salt.  Get regular exercise. This is one of the most important things you can do for your health.  Most adults should exercise for at least 150 minutes each week. The exercise should increase your heart rate and make you sweat (moderate-intensity exercise).  Most adults should also do strengthening exercises at least twice a week. This is in addition to the moderate-intensity exercise.  Maintain a healthy weight  Body mass index (BMI) is a measurement that can be used to identify possible weight problems. It estimates body fat based on height and weight. Your health care provider can help determine your BMI and help you achieve or maintain a healthy weight.  For females 67 years of age and older:   A BMI below 18.5 is considered underweight.  A BMI of 18.5 to 24.9 is normal.  A BMI of 25 to 29.9 is considered overweight.  A BMI of 30 and above is considered obese.  Watch  levels of cholesterol and blood lipids  You should start having your blood tested for lipids and cholesterol at 51 years of age, then have this test every 5 years.  You may need to have your cholesterol levels checked more often if:  Your lipid or cholesterol levels are high.  You are older than 51 years of age.  You are at high risk for heart disease.  CANCER SCREENING   Lung Cancer  Lung cancer screening is recommended for adults 36-56 years old who are at high risk for lung cancer because of a history of smoking.  A yearly low-dose CT scan of the lungs is recommended for people who:  Currently smoke.  Have quit within the past 15 years.  Have  at least a 30-pack-year history of smoking. A pack year is smoking an average of one pack of cigarettes a day for 1 year.  Yearly screening should continue until it has been 15 years since you quit.  Yearly screening should stop if you develop a health problem that would prevent you from having lung cancer treatment.  Breast Cancer  Practice breast self-awareness. This means understanding how your breasts normally appear and feel.  It also means doing regular breast self-exams. Let your health care provider know about any changes, no matter how small.  If you are in your 20s or 30s, you should have a clinical breast exam (CBE) by a health care provider every 1-3 years as part of a regular health exam.  If you are 58 or older, have a CBE every year. Also consider having a breast X-ray (mammogram) every year.  If you have a family history of breast cancer, talk to your health care provider about genetic screening.  If you are at high risk for breast cancer, talk to your health care provider about having an MRI and a mammogram every year.  Breast cancer gene (BRCA) assessment is recommended for women who have family members with BRCA-related cancers. BRCA-related cancers include:  Breast.  Ovarian.  Tubal.  Peritoneal cancers.  Results of the assessment will determine the need for genetic counseling and BRCA1 and BRCA2 testing. Cervical Cancer Routine pelvic examinations to screen for cervical cancer are no longer recommended for nonpregnant women who are considered low risk for cancer of the pelvic organs (ovaries, uterus, and vagina) and who do not have symptoms. A pelvic examination may be necessary if you have symptoms including those associated with pelvic infections. Ask your health care provider if a screening pelvic exam is right for you.   The Pap test is the screening test for cervical cancer for women who are considered at risk.  If you had a hysterectomy for a problem  that was not cancer or a condition that could lead to cancer, then you no longer need Pap tests.  If you are older than 65 years, and you have had normal Pap tests for the past 10 years, you no longer need to have Pap tests.  If you have had past treatment for cervical cancer or a condition that could lead to cancer, you need Pap tests and screening for cancer for at least 20 years after your treatment.  If you no longer get a Pap test, assess your risk factors if they change (such as having a new sexual partner). This can affect whether you should start being screened again.  Some women have medical problems that increase their chance of getting cervical cancer. If this is the case for you,  your health care provider may recommend more frequent screening and Pap tests.  The human papillomavirus (HPV) test is another test that may be used for cervical cancer screening. The HPV test looks for the virus that can cause cell changes in the cervix. The cells collected during the Pap test can be tested for HPV.  The HPV test can be used to screen women 23 years of age and older. Getting tested for HPV can extend the interval between normal Pap tests from three to five years.  An HPV test also should be used to screen women of any age who have unclear Pap test results.  After 51 years of age, women should have HPV testing as often as Pap tests.  Colorectal Cancer  This type of cancer can be detected and often prevented.  Routine colorectal cancer screening usually begins at 51 years of age and continues through 51 years of age.  Your health care provider may recommend screening at an earlier age if you have risk factors for colon cancer.  Your health care provider may also recommend using home test kits to check for hidden blood in the stool.  A small camera at the end of a tube can be used to examine your colon directly (sigmoidoscopy or colonoscopy). This is done to check for the earliest forms  of colorectal cancer.  Routine screening usually begins at age 64.  Direct examination of the colon should be repeated every 5-10 years through 51 years of age. However, you may need to be screened more often if early forms of precancerous polyps or small growths are found. Skin Cancer  Check your skin from head to toe regularly.  Tell your health care provider about any new moles or changes in moles, especially if there is a change in a mole's shape or color.  Also tell your health care provider if you have a mole that is larger than the size of a pencil eraser.  Always use sunscreen. Apply sunscreen liberally and repeatedly throughout the day.  Protect yourself by wearing long sleeves, pants, a wide-brimmed hat, and sunglasses whenever you are outside. HEART DISEASE, DIABETES, AND HIGH BLOOD PRESSURE   Have your blood pressure checked at least every 1-2 years. High blood pressure causes heart disease and increases the risk of stroke.  If you are between 70 years and 8 years old, ask your health care provider if you should take aspirin to prevent strokes.  Have regular diabetes screenings. This involves taking a blood sample to check your fasting blood sugar level.  If you are at a normal weight and have a low risk for diabetes, have this test once every three years after 51 years of age.  If you are overweight and have a high risk for diabetes, consider being tested at a younger age or more often. PREVENTING INFECTION  Hepatitis B  If you have a higher risk for hepatitis B, you should be screened for this virus. You are considered at high risk for hepatitis B if:  You were born in a country where hepatitis B is common. Ask your health care provider which countries are considered high risk.  Your parents were born in a high-risk country, and you have not been immunized against hepatitis B (hepatitis B vaccine).  You have HIV or AIDS.  You use needles to inject street  drugs.  You live with someone who has hepatitis B.  You have had sex with someone who has hepatitis B.  You  get hemodialysis treatment.  You take certain medicines for conditions, including cancer, organ transplantation, and autoimmune conditions. Hepatitis C  Blood testing is recommended for:  Everyone born from [birth year] through 1965-12-22.  Anyone with known risk factors for hepatitis C. Sexually transmitted infections (STIs)  You should be screened for sexually transmitted infections (STIs) including gonorrhea and chlamydia if:  You are sexually active and are younger than 51 years of age.  You are older than 51 years of age and your health care provider tells you that you are at risk for this type of infection.  Your sexual activity has changed since you were last screened and you are at an increased risk for chlamydia or gonorrhea. Ask your health care provider if you are at risk.  If you do not have HIV, but are at risk, it may be recommended that you take a prescription medicine daily to prevent HIV infection. This is called pre-exposure prophylaxis (PrEP). You are considered at risk if:  You are sexually active and do not regularly use condoms or know the HIV status of your partner(s).  You take drugs by injection.  You are sexually active with a partner who has HIV. Talk with your health care provider about whether you are at high risk of being infected with HIV. If you choose to begin PrEP, you should first be tested for HIV. You should then be tested every 3 months for as long as you are taking PrEP.  PREGNANCY   If you are premenopausal and you may become pregnant, ask your health care provider about preconception counseling.  If you may become pregnant, take 400 to 800 micrograms (mcg) of folic acid every day.  If you want to prevent pregnancy, talk to your health care provider about birth control (contraception). OSTEOPOROSIS AND MENOPAUSE   Osteoporosis is a disease in  which the bones lose minerals and strength with aging. This can result in serious bone fractures. Your risk for osteoporosis can be identified using a bone density scan.  If you are 99 years of age or older, or if you are at risk for osteoporosis and fractures, ask your health care provider if you should be screened.  Ask your health care provider whether you should take a calcium or vitamin D supplement to lower your risk for osteoporosis.  Menopause may have certain physical symptoms and risks.  Hormone replacement therapy may reduce some of these symptoms and risks. Talk to your health care provider about whether hormone replacement therapy is right for you.  HOME CARE INSTRUCTIONS   Schedule regular health, dental, and eye exams.  Stay current with your immunizations.   Do not use any tobacco products including cigarettes, chewing tobacco, or electronic cigarettes.  If you are pregnant, do not drink alcohol.  If you are breastfeeding, limit how much and how often you drink alcohol.  Limit alcohol intake to no more than 1 drink per day for nonpregnant women. One drink equals 12 ounces of beer, 5 ounces of wine, or 1 ounces of hard liquor.  Do not use street drugs.  Do not share needles.  Ask your health care provider for help if you need support or information about quitting drugs.  Tell your health care provider if you often feel depressed.  Tell your health care provider if you have ever been abused or do not feel safe at home. Document Released: 03/13/2011 Document Revised: 01/12/2014 Document Reviewed: 07/30/2013 [facility] Patient Information 2015 [place], [place]. This information is not  intended to replace advice given to you by your health care provider. Make sure you discuss any questions you have with your health care provider.

## 2015-04-26 NOTE — Assessment & Plan Note (Addendum)
She stopped the medication 2 months ago due to mood changes,  Started feeling depressed. Phentermine also not tolerated.   Discussed trial of citalopram and B12 supplementation with monthly or weekly if needed injections.  Instructions and demonstration given by staff,  rx sent to pharmacy.  Return in 3 months.

## 2015-04-26 NOTE — Progress Notes (Signed)
Pre-visit discussion using our clinic review tool. No additional management support is needed unless otherwise documented below in the visit note.  

## 2015-04-26 NOTE — Progress Notes (Signed)
Pt given verbal and visual instructions on giving self proper IM injection in anterior thigh area.  Pt verbalized understanding

## 2015-04-26 NOTE — Assessment & Plan Note (Signed)
Secondary to use of hctz  .  Will supplement,  Then change PB mes to losartan only;  100 mg

## 2015-04-26 NOTE — Assessment & Plan Note (Addendum)
Elevated today,  And recent labs notable for hypokalemia.  Changing osartan hct to losartan 100 mg daily due to hypokalemia.

## 2015-04-27 ENCOUNTER — Encounter: Payer: Self-pay | Admitting: Internal Medicine

## 2015-04-27 DIAGNOSIS — Z0000 Encounter for general adult medical examination without abnormal findings: Secondary | ICD-10-CM | POA: Insufficient documentation

## 2015-04-27 NOTE — Assessment & Plan Note (Signed)
Mild, LDL < 130,  HDL low but improved.  Continue reassesment in 6 months and continue Mediterranean style diet   Lab Results  Component Value Date   CHOL 174 04/20/2015   HDL 38.80* 04/20/2015   LDLCALC 117* 04/20/2015   TRIG 89.0 04/20/2015   CHOLHDL 4 04/20/2015

## 2015-04-27 NOTE — Assessment & Plan Note (Signed)
Annual comprehensive preventive exam was done as well as an evaluation and management of acute and chronic conditions .  During the course of the visit the patient was educated and counseled about appropriate screening and preventive services including :  diabetes screening, lipid analysis with projected  10 year  risk for CAD , nutrition counseling,  colorectal cancer screening, and recommended immunizations.  Printed recommendations for health maintenance screenings was given.  

## 2015-04-27 NOTE — Assessment & Plan Note (Signed)
She continues to have normal A1c.  Prevention of diabetes advised with Low GI diet and wt loss with regular exercise  Lab Results  Component Value Date   HGBA1C 5.7 04/20/2015

## 2015-05-07 NOTE — Telephone Encounter (Signed)
Mailed patient her unread mychart message  

## 2015-05-26 ENCOUNTER — Telehealth: Payer: Self-pay | Admitting: *Deleted

## 2015-05-26 NOTE — Telephone Encounter (Signed)
Patient requested to know how often will she use the B-12 injections at home, and how many units -Thanks

## 2015-05-27 NOTE — Telephone Encounter (Signed)
Left message for patient to return call to office. 

## 2015-05-28 NOTE — Telephone Encounter (Signed)
Patient returned Kathy's phone call. Thanks!

## 2015-05-31 NOTE — Telephone Encounter (Signed)
Sent patient Mychart message.

## 2015-06-01 ENCOUNTER — Telehealth: Payer: Self-pay | Admitting: *Deleted

## 2015-06-01 NOTE — Telephone Encounter (Signed)
Pt called, transferred to me.  States she is unsure how draw up her B12 injection vial.  I ask pt to get one of her syringes and with her looking at it I advised her that she was to draw up the medication to the 1mL mark.  Pt furthered stated she expected it to have been written on the packaging.

## 2015-06-02 NOTE — Telephone Encounter (Signed)
I sent patient my chart message informing her to draw up 1 ML

## 2015-07-19 ENCOUNTER — Other Ambulatory Visit: Payer: Self-pay | Admitting: Internal Medicine

## 2015-07-19 ENCOUNTER — Telehealth: Payer: Self-pay | Admitting: Internal Medicine

## 2015-07-19 MED ORDER — AMLODIPINE BESYLATE 10 MG PO TABS: 10.0000 mg | ORAL_TABLET | Freq: Every day | ORAL | Status: DC

## 2015-07-19 MED ORDER — LISINOPRIL 40 MG PO TABS: 40.0000 mg | ORAL_TABLET | Freq: Every day | ORAL | Status: DC

## 2015-07-19 NOTE — Telephone Encounter (Signed)
Pt called about wanting to take her old blood pressure medication she states that losartan (COZAAR) 100 MG tablet is giving her a negative effect. Pt wants to go back to what she was on. [facility] [address]. Pt has not had medication in three days. Thank You!

## 2015-07-19 NOTE — Telephone Encounter (Signed)
Pt states that she wanted to go back on the Lisinopril, not the Amlodipine.

## 2015-07-19 NOTE — Telephone Encounter (Signed)
Rx sent in for Lisinopril & I called & cancel Rx for Amlodipine & Losartan. Pt notified also.

## 2015-07-19 NOTE — Telephone Encounter (Signed)
SEE BELOW THANKS

## 2015-07-19 NOTE — Telephone Encounter (Signed)
I can only assume she is referring to amlodipine so I sent this to her pharmacy

## 2015-11-03 ENCOUNTER — Ambulatory Visit (INDEPENDENT_AMBULATORY_CARE_PROVIDER_SITE_OTHER): Payer: BC Managed Care – PPO | Admitting: Family Medicine

## 2015-11-03 ENCOUNTER — Encounter: Payer: Self-pay | Admitting: Family Medicine

## 2015-11-03 VITALS — BP 146/102 | HR 83 | Temp 98.3°F | Ht 66.0 in

## 2015-11-03 DIAGNOSIS — I10 Essential (primary) hypertension: Secondary | ICD-10-CM

## 2015-11-03 MED ORDER — CARVEDILOL 3.125 MG PO TABS: 3.1250 mg | ORAL_TABLET | Freq: Two times a day (BID) | ORAL | Status: DC

## 2015-11-03 NOTE — Assessment & Plan Note (Signed)
Established problem, worsening. Discussed adding an additional medication today as her lisinopril is at max dose. We had a long discussion about pharmacologic options and she ultimately agreed with adding a beta blocker (discussed adding diuretic or Norvasc and she states that she's had side effects in the past and therefore declined). Adding Coreg today.

## 2015-11-03 NOTE — Progress Notes (Signed)
Subjective:  Patient ID: Amy House, female    DOB: 19-Aug-1966  Age: 50 y.o. MRN: [id number]  CC: Elevated BP  HPI:  50 year old female with a past medical history of morbid obesity and hypertension presents with complaints of elevated blood pressure.  Patient states that she was at her GYN office yesterday and her blood pressure was markedly elevated (160/100). They informed her that she needed to follow-up with her primary.  She presents today for evaluation of her elevated blood pressure and to discuss treatment options if needed. No reports of chest pain, shortness of breath. Patient does report frequent hot flashes and difficulty sleeping as she is going through menopause. She endorses compliance with her home lisinopril.  Social Hx   Social History   Social History  . Marital Status: Married    Spouse Name: N/A  . Number of Children: N/A  . Years of Education: N/A   Social History Main Topics  . Smoking status: Former Smoker -- 0.50 packs/day    Types: Cigarettes    Quit date: 04/01/1993  . Smokeless tobacco: Never Used  . Alcohol Use: No  . Drug Use: No  . Sexual Activity: Not Asked   Other Topics Concern  . None   Social History Narrative   Review of Systems  Respiratory: Negative for shortness of breath.   Cardiovascular: Negative for chest pain.   Objective:  BP 146/102 mmHg  Pulse 83  Temp(Src) 98.3 F (36.8 C) (Oral)  Ht  (1.676 m)  Wt   SpO2 96%  BP/Weight 11/03/2015 04/26/2015 05/29/2014  Systolic BP 146 128 138  Diastolic BP 102 88 84  Wt. (Lbs) - 285.5 283.25  BMI - 46.1 46.07   Physical Exam  Constitutional: She is oriented to person, place, and time.  Morbidly obese female in no acute distress.  Cardiovascular: Normal rate and regular rhythm.   No LE edema.   Pulmonary/Chest: Effort normal and breath sounds normal.  Neurological: She is alert and oriented to person, place, and time.  Vitals reviewed.  Lab Results    Component Value Date   WBC 7.7 04/20/2015   HGB 12.6 04/20/2015   HCT 38.4 04/20/2015   PLT 212.0 04/20/2015   GLUCOSE 81 04/20/2015   CHOL 174 04/20/2015   TRIG 89.0 04/20/2015   HDL 38.80* 04/20/2015   LDLCALC 117* 04/20/2015   ALT 14 04/20/2015   AST 18 04/20/2015   NA 137 04/20/2015   K 3.2* 04/20/2015   CL 102 04/20/2015   CREATININE 0.71 04/20/2015   BUN 16 04/20/2015   CO2 28 04/20/2015   TSH 0.81 04/17/2014   HGBA1C 5.7 04/20/2015   MICROALBUR 2.2* 04/20/2015   Assessment & Plan:   Problem List Items Addressed This Visit    Hypertension - Primary    Established problem, worsening. Discussed adding an additional medication today as her lisinopril is at max dose. We had a long discussion about pharmacologic options and she ultimately agreed with adding a beta blocker (discussed adding diuretic or Norvasc and she states that she's had side effects in the past and therefore declined). Adding Coreg today.      Relevant Medications   carvedilol (COREG) 3.125 MG tablet      Meds ordered this encounter  Medications  . carvedilol (COREG) 3.125 MG tablet    Sig: Take 1 tablet (3.125 mg total) by mouth 2 (two) times daily with a meal.    Dispense:  60 tablet  Refill:  0   Follow-up: 2 weeks for BP check   Everlene Other DO [facility]

## 2015-11-03 NOTE — Progress Notes (Signed)
Pre visit review using our clinic review tool, if applicable. No additional management support is needed unless otherwise documented below in the visit note. 

## 2015-11-03 NOTE — Patient Instructions (Signed)
Take the coreg as prescribed.  Follow up for a repeat BP check in 2 weeks.  Take care  Dr. Adriana Simas

## 2015-12-06 ENCOUNTER — Telehealth: Payer: Self-pay | Admitting: Internal Medicine

## 2015-12-08 ENCOUNTER — Telehealth: Payer: Self-pay | Admitting: Internal Medicine

## 2015-12-08 MED ORDER — CARVEDILOL 3.125 MG PO TABS: 3.1250 mg | ORAL_TABLET | Freq: Two times a day (BID) | ORAL | Status: DC

## 2015-12-08 MED ORDER — LISINOPRIL 40 MG PO TABS: 40.0000 mg | ORAL_TABLET | Freq: Every day | ORAL | Status: DC

## 2015-12-08 NOTE — Telephone Encounter (Signed)
Pt dropped off her BP readings as per Dr. Patsey Bertholdook's request. BP readings are in Dr. Patsey Bertholdook's box. Pt's lisinopril (PRINIVIL,ZESTRIL) 40 MG tablet is out and needs another refill. Pt is also requesting a refill on her carvedilol (COREG) 3.125 MG tablet, she has about 1 weeks worth of that medication.

## 2015-12-08 NOTE — Telephone Encounter (Signed)
Refilled per request.

## 2015-12-08 NOTE — Telephone Encounter (Signed)
Noted  

## 2015-12-09 ENCOUNTER — Telehealth: Payer: Self-pay | Admitting: Internal Medicine

## 2015-12-09 NOTE — Telephone Encounter (Signed)
Pt called stating she needs a letter stating that she physical fitness for health purposes. Do not indicate weight. And that she is a member at that gym with letter with Dr Darrick Huntsmanullo signature. Call pt @ [phone]. Pt needs it for tomorrow. Thank you!

## 2015-12-09 NOTE — Telephone Encounter (Signed)
Olegario MessierKathy do we do letter for gym purposes? Please advise, thanks

## 2015-12-10 NOTE — Telephone Encounter (Signed)
Patient called back and was told she would need an appointment to have Dr.Tullo write a letter. Patient stated she call back to schedule an appointment.

## 2015-12-10 NOTE — Telephone Encounter (Signed)
LMOMTCB

## 2015-12-10 NOTE — Telephone Encounter (Signed)
Yes we can do a letter, but not at short notice patient needs an appointment due to Dr.Tullo has not seen since 8/16 and last visit with Dr. Adriana Simasook was for hypertension. Plus, need more information such as purpose  Of letter, whom is the letter to.

## 2015-12-13 ENCOUNTER — Telehealth: Payer: Self-pay | Admitting: Internal Medicine

## 2015-12-13 MED ORDER — CARVEDILOL 3.125 MG PO TABS: 3.1250 mg | ORAL_TABLET | Freq: Two times a day (BID) | ORAL | Status: DC

## 2015-12-13 MED ORDER — LOSARTAN POTASSIUM 100 MG PO TABS: 100.0000 mg | ORAL_TABLET | Freq: Every day | ORAL | Status: DC

## 2015-12-13 NOTE — Telephone Encounter (Signed)
Home BP readings reviewed.  Medication refill request handled thusly:  Her BP is elevated but I cannot adjust her carvedilol without pulse readings . Therefore I have refilled the carvedilol at the curretn dose and have changed the lisinopril to losartan since it is a little stronger.  still once daily . Follow up one month

## 2015-12-14 NOTE — Telephone Encounter (Signed)
Left message for patient to return call to office. 

## 2015-12-20 NOTE — Telephone Encounter (Signed)
Per patient DPR and phone message left detailed message and advised patient to schedule one month follow up.

## 2015-12-20 NOTE — Telephone Encounter (Signed)
Pt called returning your call. Call pt @ [phone]. Thank you!

## 2016-03-16 ENCOUNTER — Telehealth: Payer: Self-pay | Admitting: Internal Medicine

## 2016-03-16 DIAGNOSIS — R5383 Other fatigue: Secondary | ICD-10-CM

## 2016-03-16 DIAGNOSIS — I10 Essential (primary) hypertension: Secondary | ICD-10-CM

## 2016-03-16 DIAGNOSIS — E785 Hyperlipidemia, unspecified: Secondary | ICD-10-CM

## 2016-03-16 DIAGNOSIS — E559 Vitamin D deficiency, unspecified: Secondary | ICD-10-CM

## 2016-03-16 DIAGNOSIS — R7302 Impaired glucose tolerance (oral): Secondary | ICD-10-CM

## 2016-03-16 NOTE — Telephone Encounter (Signed)
Fasting labs ordered

## 2016-03-16 NOTE — Telephone Encounter (Signed)
Pt called wanting to get labs done before her appt on 08/19. Need orders please and thank you!  Call pt @ [phone]. Thank you!

## 2016-03-16 NOTE — Telephone Encounter (Signed)
Last labs were in August 2016, please advise and order, thanks

## 2016-03-17 NOTE — Telephone Encounter (Signed)
Noted thanks °

## 2016-03-17 NOTE — Telephone Encounter (Signed)
Ok. Let msg for pt to call me to sch lab appt.

## 2016-03-17 NOTE — Telephone Encounter (Signed)
Pt is scheduled for 04/21/16.

## 2016-03-17 NOTE — Telephone Encounter (Signed)
She can be scheduled for a lab appt now, thanks

## 2016-04-01 ENCOUNTER — Other Ambulatory Visit: Payer: Self-pay | Admitting: Internal Medicine

## 2016-04-21 ENCOUNTER — Other Ambulatory Visit (INDEPENDENT_AMBULATORY_CARE_PROVIDER_SITE_OTHER): Payer: BC Managed Care – PPO

## 2016-04-21 DIAGNOSIS — E785 Hyperlipidemia, unspecified: Secondary | ICD-10-CM | POA: Diagnosis not present

## 2016-04-21 DIAGNOSIS — R7302 Impaired glucose tolerance (oral): Secondary | ICD-10-CM | POA: Diagnosis not present

## 2016-04-21 DIAGNOSIS — R5383 Other fatigue: Secondary | ICD-10-CM

## 2016-04-21 DIAGNOSIS — I10 Essential (primary) hypertension: Secondary | ICD-10-CM | POA: Diagnosis not present

## 2016-04-21 DIAGNOSIS — E559 Vitamin D deficiency, unspecified: Secondary | ICD-10-CM

## 2016-04-21 LAB — LIPID PANEL
CHOLESTEROL: 180 mg/dL (ref 0–200)
HDL: 37.3 mg/dL — AB (ref >39.00)
LDL CALC: 118 mg/dL — AB (ref 0–99)
NonHDL: 143.15
TRIGLYCERIDES: 124 mg/dL (ref 0.0–149.0)
Total CHOL/HDL Ratio: 5
VLDL: 24.8 mg/dL (ref 0.0–40.0)

## 2016-04-21 LAB — CBC WITH DIFFERENTIAL/PLATELET
BASOS PCT: 0.5 % (ref 0.0–3.0)
Basophils Absolute: 0 10*3/uL (ref 0.0–0.1)
EOS PCT: 1.9 % (ref 0.0–5.0)
Eosinophils Absolute: 0.1 10*3/uL (ref 0.0–0.7)
HEMATOCRIT: 37.5 % (ref 36.0–46.0)
HEMOGLOBIN: 12.4 g/dL (ref 12.0–15.0)
LYMPHS PCT: 29.3 % (ref 12.0–46.0)
Lymphs Abs: 2 10*3/uL (ref 0.7–4.0)
MCHC: 33.1 g/dL (ref 30.0–36.0)
MCV: 80.6 fl (ref 78.0–100.0)
Monocytes Absolute: 0.3 10*3/uL (ref 0.1–1.0)
Monocytes Relative: 4.6 % (ref 3.0–12.0)
NEUTROS ABS: 4.3 10*3/uL (ref 1.4–7.7)
Neutrophils Relative %: 63.7 % (ref 43.0–77.0)
PLATELETS: 212 10*3/uL (ref 150.0–400.0)
RBC: 4.66 Mil/uL (ref 3.87–5.11)
RDW: 15.5 % (ref 11.5–15.5)
WBC: 6.8 10*3/uL (ref 4.0–10.5)

## 2016-04-21 LAB — COMPREHENSIVE METABOLIC PANEL
ALBUMIN: 4.1 g/dL (ref 3.5–5.2)
ALK PHOS: 106 U/L (ref 39–117)
ALT: 15 U/L (ref 0–35)
AST: 19 U/L (ref 0–37)
BILIRUBIN TOTAL: 0.6 mg/dL (ref 0.2–1.2)
BUN: 12 mg/dL (ref 6–23)
CO2: 29 mEq/L (ref 19–32)
CREATININE: 0.75 mg/dL (ref 0.40–1.20)
Calcium: 9.5 mg/dL (ref 8.4–10.5)
Chloride: 104 mEq/L (ref 96–112)
GFR: 104.19 mL/min (ref >60.00)
GLUCOSE: 102 mg/dL — AB (ref 70–99)
POTASSIUM: 3.5 meq/L (ref 3.5–5.1)
Sodium: 135 mEq/L (ref 135–145)
TOTAL PROTEIN: 7.3 g/dL (ref 6.0–8.3)

## 2016-04-21 LAB — MICROALBUMIN / CREATININE URINE RATIO
Creatinine,U: 156.4 mg/dL
Microalb Creat Ratio: 2.4 mg/g (ref 0.0–30.0)
Microalb, Ur: 3.7 mg/dL — ABNORMAL HIGH (ref 0.0–1.9)

## 2016-04-21 LAB — HEMOGLOBIN A1C: Hgb A1c MFr Bld: 5.6 % (ref 4.6–6.5)

## 2016-04-21 LAB — TSH: TSH: 1.26 u[IU]/mL (ref 0.35–4.50)

## 2016-04-21 LAB — VITAMIN D 25 HYDROXY (VIT D DEFICIENCY, FRACTURES): VITD: 10.05 ng/mL — AB (ref 30.00–100.00)

## 2016-04-22 LAB — HEPATITIS C ANTIBODY: HCV Ab: NEGATIVE

## 2016-04-23 ENCOUNTER — Other Ambulatory Visit: Payer: Self-pay | Admitting: Internal Medicine

## 2016-04-23 ENCOUNTER — Encounter: Payer: Self-pay | Admitting: Internal Medicine

## 2016-04-23 DIAGNOSIS — E559 Vitamin D deficiency, unspecified: Secondary | ICD-10-CM | POA: Insufficient documentation

## 2016-04-23 MED ORDER — ERGOCALCIFEROL 1.25 MG (50000 UT) PO CAPS: 50000.0000 [IU] | ORAL_CAPSULE | ORAL | 0 refills | Status: DC

## 2016-04-27 ENCOUNTER — Encounter: Payer: Self-pay | Admitting: Internal Medicine

## 2016-04-27 ENCOUNTER — Ambulatory Visit (INDEPENDENT_AMBULATORY_CARE_PROVIDER_SITE_OTHER): Payer: BC Managed Care – PPO | Admitting: Internal Medicine

## 2016-04-27 VITALS — BP 158/108 | HR 78 | Temp 97.7°F | Resp 14 | Ht 66.0 in | Wt 289.2 lb

## 2016-04-27 DIAGNOSIS — I10 Essential (primary) hypertension: Secondary | ICD-10-CM | POA: Diagnosis not present

## 2016-04-27 DIAGNOSIS — Z6841 Body Mass Index (BMI) 40.0 and over, adult: Secondary | ICD-10-CM | POA: Diagnosis not present

## 2016-04-27 DIAGNOSIS — E876 Hypokalemia: Secondary | ICD-10-CM | POA: Diagnosis not present

## 2016-04-27 DIAGNOSIS — Z0000 Encounter for general adult medical examination without abnormal findings: Secondary | ICD-10-CM | POA: Diagnosis not present

## 2016-04-27 MED ORDER — CLONIDINE HCL 0.1 MG PO TABS: 0.1000 mg | ORAL_TABLET | Freq: Every day | ORAL | 3 refills | Status: DC

## 2016-04-27 NOTE — Progress Notes (Signed)
Patient ID: Amy House, female    DOB: 1965-11-12  Age: 50 y.o. MRN: [id number]  The patient is here for annual  Non GYN  examination but has not been seen in a year and has multiple complaints. .   Mammogram and breast exam last week  [facility] by [facility] IN [place] OFF[facility] GYN despite being s/p hysterectomy Colonoscopy 2 years ago, [place]    The risk factors are reflected in the social history.  The roster of all physicians providing medical care to patient - is listed in the Snapshot section of the chart. Home safety : The patient has smoke detectors in the home. They wear seatbelts.  There are no firearms at home. There is no violence in the home.   There is no risks for hepatitis, STDs or HIV. There is no   history of blood transfusion. They have no travel history to infectious disease endemic areas of the world.  The patient has seen their dentist in the last six month. They have seen their eye doctor in the last year. T They do not  have excessive sun exposure. Discussed the need for sun protection: hats, long sleeves and use of sunscreen if there is significant sun exposure.   Diet: the importance of a healthy diet is discussed.  Has Special K or 2 eggs for breakfast .  Lunch is r\usually fast food.  Dinner is a Patent attorneygrilled meat,  Vegetable and starch.   y .  The benefits of regular aerobic exercise were discussed. She states that she has been going to the gym 3-4 times per week all summer .  Treadmill,  Bicycle,  And working upper body/arms   Depression screen: there are no signs or vegative symptoms of depression- irritability, change in appetite, anhedonia, sadness/tearfullness.  The following portions of the patient's history were reviewed and updated as appropriate: allergies, current medications, past family history, past medical history,  past surgical history, past social history  and problem list.  Visual acuity was not assessed per patient preference  since she has regular follow up with her ophthalmologist. Hearing and body mass index were assessed and reviewed.   During the course of the visit the patient was educated and counseled about appropriate screening and preventive services including : fall prevention , diabetes screening, nutrition counseling, colorectal cancer screening, and recommended immunizations.    CC: Diagnoses of Encounter for preventive health examination, Essential hypertension, Hypokalemia, and Morbid obesity with BMI of 45.0-49.9, adult (HCC) were pertinent to this visit.   Morning fatigeue. Chronic Insomnia secondary to recurrent hot flashes which aggravate her anxiety.  Not taking celexa.  Has tried  Black cohosh  .  Mind races at night,  Wakes up drenched. Husband does not report any snoring or apneic breathing and she has had a sleep study in the past, In [place] over 5 years ago.     Hypertension: Not taking carvedilol .  Only taking losartan .   Getting B12 injections at school by RN monthly.     History Amy House has a past medical history of Bradycardia; Hyperlipidemia; Hypertension; and Obesity (BMI 30-39.9).   She has a past surgical history that includes Abdominal hysterectomy and Tonsillectomy (1979).   Her family history includes Diabetes in her mother.She reports that she quit smoking about 23 years ago. Her smoking use included Cigarettes. She smoked 0.50 packs per day. She has never used smokeless tobacco. She reports that she does not drink alcohol or  use drugs.  Outpatient Medications Prior to Visit  Medication Sig Dispense Refill  . aspirin 81 MG tablet Take 81 mg by mouth daily.      . citalopram (CELEXA) 10 MG tablet Take 1 tablet (10 mg total) by mouth daily. 90 tablet 1  . cyanocobalamin (,VITAMIN B-12,) 1000 MCG/ML injection For use with monthly B12 injections 10 mL 2  . ergocalciferol (DRISDOL) 50000 units capsule Take 1 capsule (50,000 Units total) by mouth once a week. 12 capsule 0  .  KLOR-CON M20 20 MEQ tablet TAKE ONE TABLET BY MOUTH ONCE DAILY 30 tablet 0  . losartan (COZAAR) 100 MG tablet Take 1 tablet (100 mg total) by mouth daily. 30 tablet 1  . Syringe, Disposable, 1 ML MISC FOR USE WITH B12 INJECTION S 25 each 0  . valACYclovir (VALTREX) 500 MG tablet     . carvedilol (COREG) 3.125 MG tablet Take 1 tablet (3.125 mg total) by mouth 2 (two) times daily with a meal. (Patient not taking: Reported on 04/27/2016) 180 tablet 0   No facility-administered medications prior to visit.     Review of Systems  Patient denies headache, fevers, malaise, unintentional weight loss, skin rash, eye pain, sinus congestion and sinus pain, sore throat, dysphagia,  hemoptysis , cough, dyspnea, wheezing, chest pain, palpitations, orthopnea, edema, abdominal pain, nausea, melena, diarrhea, constipation, flank pain, dysuria, hematuria, urinary  Frequency, nocturia, numbness, tingling, seizures,  Focal weakness, Loss of consciousness,  Tremor, insomnia, depression, anxiety, and suicidal ideation.       Objective:  BP (!) 158/108   Pulse 78   Temp 97.7 F (36.5 C) (Oral)   Resp 14   Ht 5\' 6"  (1.676 m)   Wt 289 lb 4 oz (131.2 kg)   SpO2 95%   BMI 46.69 kg/m   Physical Exam   General appearance: alert, cooperative and appears stated age Ears: normal TM's and external ear canals both ears Throat: lips, mucosa, and tongue normal; teeth and gums normal Neck: no adenopathy, no carotid bruit, supple, symmetrical, trachea midline and thyroid not enlarged, symmetric, no tenderness/mass/nodules Back: symmetric, no curvature. ROM normal. No CVA tenderness. Lungs: clear to auscultation bilaterally Heart: regular rate and rhythm, S1, S2 normal, no murmur, click, rub or gallop Abdomen: soft, non-tender; bowel sounds normal; no masses,  no organomegaly Pulses: 2+ and symmetric Skin: Skin color, texture, turgor normal. No rashes or lesions Lymph nodes: Cervical, supraclavicular, and axillary  nodes normal.   Assessment & Plan:   Problem List Items Addressed This Visit    Hypertension    Elevated today.  Reviewed list of meds, patient is not taking carvedilol.  Asked to resume medication , continue losartan and  recheck bp at work in one week  and call readings to office for adjustment of medications.        Relevant Medications   cloNIDine (CATAPRES) 0.1 MG tablet   Morbid obesity with BMI of 45.0-49.9, adult [facility])    Discussed bariatric surgery and the reason it is only a temporary but very drastic solution . I have addressed  BMI and recommended a low glycemic index diet utilizing smaller more frequent meals to increase metabolism.  I have also recommended that patient start exercising with a goal of 30 minutes of aerobic exercise a minimum of 5 days per week. Screening for lipid disorders, thyroid and diabetes to be done today.  Lab Results  Component Value Date   HGBA1C 5.6 04/21/2016   Lab Results  Component  Value Date   TSH 1.26 04/21/2016   Lab Results  Component Value Date   CHOL 180 04/21/2016   HDL 37.30 (L) 04/21/2016   LDLCALC 118 (H) 04/21/2016   TRIG 124.0 04/21/2016   CHOLHDL 5 04/21/2016         RESOLVED: Hypokalemia   Encounter for preventive health examination    Annual comprehensive preventive exam was done as well as an evaluation and management of chronic conditions .  During the course of the visit the patient was educated and counseled about appropriate screening and preventive services including :  diabetes screening, lipid analysis with projected  10 year  risk for CAD , nutrition counseling, breast, cervical and colorectal cancer screening, and recommended immunizations.  Printed recommendations for health maintenance screenings was given       Other Visit Diagnoses   None.     I am having Ms. Scovill start on cloNIDine. I am also having her maintain her aspirin, valACYclovir, citalopram, cyanocobalamin, Syringe (Disposable),  losartan, carvedilol, KLOR-CON M20, and ergocalciferol.  Meds ordered this encounter  Medications  . cloNIDine (CATAPRES) 0.1 MG tablet    Sig: Take 1 tablet (0.1 mg total) by mouth daily. Before bedtime    Dispense:  90 tablet    Refill:  3    There are no discontinued medications.  Follow-up: Return in about 6 months (around 10/28/2016).   Sherlene ShamsULLO, Nayali Talerico L, MD

## 2016-04-27 NOTE — Progress Notes (Signed)
Pre-visit discussion using our clinic review tool. No additional management support is needed unless otherwise documented below in the visit note.  

## 2016-04-27 NOTE — Patient Instructions (Addendum)
1) For your hot flashes, insomnia and hypertension:  We are adding clonidine 0.1 mg  at bedtime to see if it helps   2) Resume carvedilol bc your BP is stroke level !!~!!   Get your BP checked In one week by school RN .and let me know if not at goal   Goal is 130/80 or less.  If not at goal, send me a message    3) I want you to lose 50 lbs over the next 6 months  through diet and exercise.  This is  Dr. Melina Schoolsullo's  example of a  "Low GI"  Diet:  It will allow you to lose 4 to 8  lbs  per month if you follow it carefully.  Your goal with exercise is a minimum of 30 minutes of aerobic exercise 5 days per week (Walking does not count once it becomes easy!)    All of the foods can be found at grocery stores and in bulk at [employer].  The Atkins protein bars and shakes are available in more varieties at Target, WalMart and [employer]'s Foods.     7 AM Breakfast:  Choose from the following:  Low carbohydrate Protein  Shakes (I recommend the  Premier Protein chocolate shakes,  EAS AdvantEdge "Carb Control" shakes  Or the Atkins shakes all are under 3 net carbs)     a scrambled egg/bacon/cheese burrito made with Mission's "carb balance" whole wheat tortilla  (about 10 net carbs )  Medical laboratory scientific officerJimmy Deans sells microwaveable frittata (basically a quiche without the pastry crust) that is eaten cold and very convenient way to get your eggs.  8 carbs)  If you make your own protein shakes, avoid bananas and pineapple,  And use low carb greek yogurt or original /unsweetened almond or soy milk    Avoid cereal and bananas, oatmeal and cream of wheat and grits. They are loaded with carbohydrates!   10 AM: high protein snack:  Protein bar by Atkins (the snack size, under 200 cal, usually < 6 net carbs).    A stick of cheese:  Around 1 carb,  100 cal     Dannon Light n Fit AustriaGreek Yogurt  (80 cal, 8 carbs)  Other so called "protein bars" and Greek yogurts tend to be loaded with carbohydrates.  Remember, in food advertising,  the word "energy" is synonymous for " carbohydrate."  Lunch:   A Sandwich using the bread choices listed, Can use any  Eggs,  lunchmeat, grilled meat or canned tuna), avocado, regular mayo/mustard  and cheese.  A Salad using blue cheese, ranch,  Goddess or vinagrette,  Avoid taco shells, croutons or "confetti" and no "candied nuts" but regular nuts OK.   No pretzels, nabs  or chips.  Pickles and miniature sweet peppers are a good low carb alternative that provide a "crunch"  The bread is the only source of carbohydrate in a sandwich and  can be decreased by trying some of the attached alternatives to traditional loaf bread   Avoid "Low fat dressings, as well as Reyne DumasCatalina and [place] dressings They are loaded with sugar!   3 PM/ Mid day  Snack:  Consider  1 ounce of  almonds, walnuts, pistachios, pecans, peanuts,  Macadamia nuts or a nut medley.  Avoid "granola and granola bars "  Mixed nuts are ok in moderation as long as there are no raisins,  cranberries or dried fruit.   KIND bars are OK if you get the low  glycemic index variety   Try the prosciutto/mozzarella cheese sticks by Fiorruci  In deli /backery section   High protein      6 PM  Dinner:     Meat/fowl/fish with a green salad, and either broccoli, cauliflower, green beans, spinach, brussel sprouts or  Lima beans. DO NOT BREAD THE PROTEIN!!      There is a low carb pasta by Dreamfield's that is acceptable and tastes great: only 5 digestible carbs/serving.( All grocery stores but BJs carry it ) Several ready made meals are available low carb:   Try Michel Angelo's chicken piccata or chicken or eggplant parm over low carb pasta.(Lowes and BJs)   Clifton CustardAaron Sanchez's "Carnitas" (pulled pork, no sauce,  0 carbs) or his beef pot roast to make a dinner burrito (at [employer]J's)  Pesto over low carb pasta (bj's sells a good quality pesto in the center refrigerated section of the deli   Try satueeing  Roosvelt HarpsBok Choy with mushroooms as a good side    [employer] Giant makes a mashed cauliflower that tastes like mashed potatoes  Whole wheat pasta is still full of digestible carbs and  Not as low in glycemic index as Dreamfield's.   Brown rice is still rice,  So skip the rice and noodles if you eat Congohinese or New Zealandhai (or at least limit to 1/2 cup)  9 PM snack :   Breyer's "low carb" fudgsicle or  ice cream bar (Carb Smart line), or  Weight Watcher's ice cream bar , or another "no sugar added" ice cream;  a serving of fresh berries/cherries with whipped cream   Cheese or DANNON'S LlGHT N FIT GREEK YOGURT  8 ounces of [place] unsweetened almond/cococunut milk    Treat yourself to a parfait made with whipped cream blueberiies, walnuts and vanilla greek yogurt  Avoid bananas, pineapple, grapes  and watermelon on a regular basis because they are high in sugar.  THINK OF THEM AS DESSERT  Remember that snack Substitutions should be less than 10 NET carbs per serving and meals < 20 carbs. Remember to subtract fiber grams to get the "net carbs."

## 2016-04-30 NOTE — Assessment & Plan Note (Signed)
Elevated today.  Reviewed list of meds, patient is not taking carvedilol.  Asked to resume medication , continue losartan and  recheck bp at work in one week  and call readings to office for adjustment of medications.

## 2016-04-30 NOTE — Assessment & Plan Note (Signed)
Discussed bariatric surgery and the reason it is only a temporary but very drastic solution . I have addressed  BMI and recommended a low glycemic index diet utilizing smaller more frequent meals to increase metabolism.  I have also recommended that patient start exercising with a goal of 30 minutes of aerobic exercise a minimum of 5 days per week. Screening for lipid disorders, thyroid and diabetes to be done today.  Lab Results  Component Value Date   HGBA1C 5.6 04/21/2016   Lab Results  Component Value Date   TSH 1.26 04/21/2016   Lab Results  Component Value Date   CHOL 180 04/21/2016   HDL 37.30 (L) 04/21/2016   LDLCALC 118 (H) 04/21/2016   TRIG 124.0 04/21/2016   CHOLHDL 5 04/21/2016

## 2016-04-30 NOTE — Assessment & Plan Note (Signed)
Annual comprehensive preventive exam was done as well as an evaluation and management of chronic conditions .  During the course of the visit the patient was educated and counseled about appropriate screening and preventive services including :  diabetes screening, lipid analysis with projected  10 year  risk for CAD , nutrition counseling, breast, cervical and colorectal cancer screening, and recommended immunizations.  Printed recommendations for health maintenance screenings was given 

## 2016-04-30 NOTE — Assessment & Plan Note (Deleted)
Recurrent,  Despite using losartan for hypertension. 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  Will add spironolactone   Lab Results  Component Value Date   NA 135 04/21/2016   K 3.5 04/21/2016   CL 104 04/21/2016   CO2 29 04/21/2016

## 2016-07-30 ENCOUNTER — Other Ambulatory Visit: Payer: Self-pay | Admitting: Internal Medicine

## 2016-08-01 ENCOUNTER — Telehealth: Payer: Self-pay | Admitting: *Deleted

## 2016-08-01 NOTE — Telephone Encounter (Signed)
Patient requested a refill for vitamin D tablets? Pharmacy [facility] [place]

## 2016-08-01 NOTE — Telephone Encounter (Signed)
Notified patient she is to start over the counter Vit D3  1000 units daily per MD.

## 2016-09-14 ENCOUNTER — Other Ambulatory Visit: Payer: Self-pay | Admitting: Internal Medicine

## 2016-09-14 NOTE — Telephone Encounter (Signed)
Last filled 04/03/16 and was only written for 30 days. Last potassium checked on 04/21/16. Last OV 04/27/16.

## 2016-09-15 NOTE — Telephone Encounter (Signed)
Do not refill potassium.  Should not bee needing it anymore . Let patient know so we don't get repeated requests.

## 2016-09-18 ENCOUNTER — Telehealth: Payer: Self-pay | Admitting: *Deleted

## 2016-09-18 ENCOUNTER — Other Ambulatory Visit: Payer: Self-pay | Admitting: Internal Medicine

## 2016-09-18 NOTE — Telephone Encounter (Signed)
[facility] [place]

## 2016-09-19 MED ORDER — POTASSIUM CHLORIDE CRYS ER 20 MEQ PO TBCR: 20.0000 meq | EXTENDED_RELEASE_TABLET | Freq: Every day | ORAL | 1 refills | Status: DC

## 2016-09-19 NOTE — Telephone Encounter (Signed)
[facility]

## 2016-09-20 NOTE — Telephone Encounter (Signed)
Dc potassium due to patient not needing potassium anymore per Dr. Darrick Huntsmanullo. LMOM for patient to be notified

## 2016-10-27 ENCOUNTER — Ambulatory Visit: Payer: BC Managed Care – PPO | Admitting: Internal Medicine

## 2016-12-13 ENCOUNTER — Ambulatory Visit: Payer: BC Managed Care – PPO | Admitting: Internal Medicine

## 2016-12-27 ENCOUNTER — Other Ambulatory Visit: Payer: Self-pay | Admitting: Family Medicine

## 2017-01-09 ENCOUNTER — Other Ambulatory Visit: Payer: Self-pay | Admitting: Internal Medicine

## 2017-01-09 DIAGNOSIS — E7800 Pure hypercholesterolemia, unspecified: Secondary | ICD-10-CM

## 2017-01-09 DIAGNOSIS — I10 Essential (primary) hypertension: Secondary | ICD-10-CM

## 2017-01-09 DIAGNOSIS — R7302 Impaired glucose tolerance (oral): Secondary | ICD-10-CM

## 2017-01-09 NOTE — Telephone Encounter (Signed)
No refills until fasting labs done. Ordered

## 2017-01-09 NOTE — Telephone Encounter (Signed)
Pt stated she'd callback and schedule appt closer to appt time.

## 2017-01-09 NOTE — Telephone Encounter (Signed)
Refilled: 09/19/16 Last OV: 04/27/16 Last Labs: 04/21/16-> 3.5 potassium Future OV: 03/08/17 Please advise?

## 2017-03-01 ENCOUNTER — Other Ambulatory Visit (INDEPENDENT_AMBULATORY_CARE_PROVIDER_SITE_OTHER): Payer: BC Managed Care – PPO

## 2017-03-01 DIAGNOSIS — R7302 Impaired glucose tolerance (oral): Secondary | ICD-10-CM | POA: Diagnosis not present

## 2017-03-01 DIAGNOSIS — E7800 Pure hypercholesterolemia, unspecified: Secondary | ICD-10-CM

## 2017-03-01 DIAGNOSIS — I10 Essential (primary) hypertension: Secondary | ICD-10-CM

## 2017-03-01 LAB — COMPREHENSIVE METABOLIC PANEL
ALBUMIN: 4.1 g/dL (ref 3.5–5.2)
ALK PHOS: 103 U/L (ref 39–117)
ALT: 14 U/L (ref 0–35)
AST: 16 U/L (ref 0–37)
BILIRUBIN TOTAL: 0.5 mg/dL (ref 0.2–1.2)
BUN: 15 mg/dL (ref 6–23)
CO2: 33 mEq/L — ABNORMAL HIGH (ref 19–32)
CREATININE: 0.86 mg/dL (ref 0.40–1.20)
Calcium: 9.6 mg/dL (ref 8.4–10.5)
Chloride: 100 mEq/L (ref 96–112)
GFR: 88.68 mL/min (ref >60.00)
Glucose, Bld: 124 mg/dL — ABNORMAL HIGH (ref 70–99)
POTASSIUM: 3.6 meq/L (ref 3.5–5.1)
SODIUM: 138 meq/L (ref 135–145)
TOTAL PROTEIN: 7.3 g/dL (ref 6.0–8.3)

## 2017-03-01 LAB — MICROALBUMIN / CREATININE URINE RATIO
Creatinine,U: 114.8 mg/dL
MICROALB/CREAT RATIO: 4.8 mg/g (ref 0.0–30.0)
Microalb, Ur: 5.5 mg/dL — ABNORMAL HIGH (ref 0.0–1.9)

## 2017-03-01 LAB — LIPID PANEL
CHOLESTEROL: 171 mg/dL (ref 0–200)
HDL: 38.9 mg/dL — ABNORMAL LOW (ref >39.00)
LDL Cholesterol: 110 mg/dL — ABNORMAL HIGH (ref 0–99)
NonHDL: 131.61
Total CHOL/HDL Ratio: 4
Triglycerides: 108 mg/dL (ref 0.0–149.0)
VLDL: 21.6 mg/dL (ref 0.0–40.0)

## 2017-03-01 LAB — HEMOGLOBIN A1C: Hgb A1c MFr Bld: 5.7 % (ref 4.6–6.5)

## 2017-03-08 ENCOUNTER — Encounter: Payer: Self-pay | Admitting: Internal Medicine

## 2017-03-08 ENCOUNTER — Ambulatory Visit (INDEPENDENT_AMBULATORY_CARE_PROVIDER_SITE_OTHER): Payer: BC Managed Care – PPO | Admitting: Internal Medicine

## 2017-03-08 DIAGNOSIS — E7800 Pure hypercholesterolemia, unspecified: Secondary | ICD-10-CM | POA: Diagnosis not present

## 2017-03-08 DIAGNOSIS — Z6841 Body Mass Index (BMI) 40.0 and over, adult: Secondary | ICD-10-CM | POA: Diagnosis not present

## 2017-03-08 DIAGNOSIS — I10 Essential (primary) hypertension: Secondary | ICD-10-CM | POA: Diagnosis not present

## 2017-03-08 MED ORDER — POTASSIUM CHLORIDE CRYS ER 20 MEQ PO TBCR
20.0000 meq | EXTENDED_RELEASE_TABLET | Freq: Every day | ORAL | 5 refills | Status: DC
Start: 2017-03-08 — End: 2019-04-28

## 2017-03-08 NOTE — Progress Notes (Signed)
Subjective:  Patient ID: Amy House, female    DOB: March 17, 1966  Age: 51 y.o. MRN: [id number]  CC: Diagnoses of Essential hypertension, Morbid obesity with BMI of 45.0-49.9, adult ([place]), and Pure hypercholesterolemia were pertinent to this visit.  HPI ALWILDA GILLAND presents for follow up on hypertension and morbid besity . Patient is taking her medications as prescribed and notes no adverse Readings have been done by the school nurse  about once per month and are  generally < 130/80, lst check 2 weeks ago.  .  She is avoiding added salt in her diet and riding a stationery bicyle about 3 times per week for exercise  .  She as changed her diet to exclude carbonated sodas.  Taking lisinopril ,  Asa, and vitamin d and potassium    Sees [facility] for annual exam .  S/p hysterectomy for fibroids  Lab Results  Component Value Date   HGBA1C 5.7 03/01/2017     Outpatient Medications Prior to Visit  Medication Sig Dispense Refill  . aspirin 81 MG tablet Take 81 mg by mouth daily.      Marland Kitchen lisinopril (PRINIVIL,ZESTRIL) 40 MG tablet TAKE ONE TABLET BY MOUTH ONCE DAILY 90 tablet 3  . valACYclovir (VALTREX) 500 MG tablet     . KLOR-CON M20 20 MEQ tablet TAKE ONE TABLET BY MOUTH ONCE DAILY 30 tablet 3  . carvedilol (COREG) 3.125 MG tablet Take 1 tablet (3.125 mg total) by mouth 2 (two) times daily with a meal. (Patient not taking: Reported on 04/27/2016) 180 tablet 0  . citalopram (CELEXA) 10 MG tablet Take 1 tablet (10 mg total) by mouth daily. (Patient not taking: Reported on 03/08/2017) 90 tablet 1  . cloNIDine (CATAPRES) 0.1 MG tablet Take 1 tablet (0.1 mg total) by mouth daily. Before bedtime (Patient not taking: Reported on 03/08/2017) 90 tablet 3  . cyanocobalamin (,VITAMIN B-12,) 1000 MCG/ML injection For use with monthly B12 injections (Patient not taking: Reported on 03/08/2017) 10 mL 2  . ergocalciferol (DRISDOL) 50000 units capsule Take 1 capsule (50,000 Units total) by mouth once a  week. (Patient not taking: Reported on 03/08/2017) 12 capsule 0  . KLOR-CON M20 20 MEQ tablet TAKE ONE TABLET BY MOUTH ONCE DAILY (Patient not taking: Reported on 03/08/2017) 30 tablet 1  . losartan (COZAAR) 100 MG tablet Take 1 tablet (100 mg total) by mouth daily. (Patient not taking: Reported on 03/08/2017) 30 tablet 1  . Syringe, Disposable, 1 ML MISC FOR USE WITH B12 INJECTION S (Patient not taking: Reported on 03/08/2017) 25 each 0   No facility-administered medications prior to visit.     Review of Systems;  Patient denies headache, fevers, malaise, unintentional weight loss, skin rash, eye pain, sinus congestion and sinus pain, sore throat, dysphagia,  hemoptysis , cough, dyspnea, wheezing, chest pain, palpitations, orthopnea, edema, abdominal pain, nausea, melena, diarrhea, constipation, flank pain, dysuria, hematuria, urinary  Frequency, nocturia, numbness, tingling, seizures,  Focal weakness, Loss of consciousness,  Tremor, insomnia, depression, anxiety, and suicidal ideation.      Objective:   BP 130/82 (BP Location: Left Arm, Patient Position: Sitting, Cuff Size: Large)   Pulse 73   Temp 97.7 F (36.5 C) (Oral)   Resp 17   Ht '5\' 6"'  (1.676 m)   Wt 285 lb 6.4 oz (129.5 kg)   SpO2 92%   BMI 46.06 kg/m    Wt Readings from Last 3 Encounters:  03/08/17 285 lb 6.4 oz (129.5 kg)  04/27/16  289 lb 4 oz (131.2 kg)  04/26/15 285 lb 8 oz (129.5 kg)    General appearance: alert, cooperative and appears stated age Ears: normal TM's and external ear canals both ears Throat: lips, mucosa, and tongue normal; teeth and gums normal Neck: no adenopathy, no carotid bruit, supple, symmetrical, trachea midline and thyroid not enlarged, symmetric, no tenderness/mass/nodules Back: symmetric, no curvature. ROM normal. No CVA tenderness. Lungs: clear to auscultation bilaterally Heart: regular rate and rhythm, S1, S2 normal, no murmur, click, rub or gallop Abdomen: soft, non-tender; bowel sounds  normal; no masses,  no organomegaly Pulses: 2+ and symmetric Skin: Skin color, texture, turgor normal. No rashes or lesions Lymph nodes: Cervical, supraclavicular, and axillary nodes normal.  Lab Results  Component Value Date   HGBA1C 5.7 03/01/2017   HGBA1C 5.6 04/21/2016   HGBA1C 5.7 04/20/2015    Lab Results  Component Value Date   CREATININE 0.86 03/01/2017   CREATININE 0.75 04/21/2016   CREATININE 0.71 04/20/2015    Lab Results  Component Value Date   WBC 6.8 04/21/2016   HGB 12.4 04/21/2016   HCT 37.5 04/21/2016   PLT 212.0 04/21/2016   GLUCOSE 124 (H) 03/01/2017   CHOL 171 03/01/2017   TRIG 108.0 03/01/2017   HDL 38.90 (L) 03/01/2017   LDLCALC 110 (H) 03/01/2017   ALT 14 03/01/2017   AST 16 03/01/2017   NA 138 03/01/2017   K 3.6 03/01/2017   CL 100 03/01/2017   CREATININE 0.86 03/01/2017   BUN 15 03/01/2017   CO2 33 (H) 03/01/2017   TSH 1.26 04/21/2016   HGBA1C 5.7 03/01/2017   MICROALBUR 5.5 (H) 03/01/2017    No results found.  Assessment & Plan:   Problem List Items Addressed This Visit    Morbid obesity with BMI of 45.0-49.9, adult ([place])    . I have addressed  BMI and recommended a low glycemic index diet utilizing smaller more frequent meals to increase metabolism.  I have also recommended that patient increase her  exercising with a goal of 30 minutes of aerobic exercise a minimum of 5 days per week. Screening for lipid disorders, thyroid and diabetes to be done today.  Lab Results  Component Value Date   HGBA1C 5.7 03/01/2017   Lab Results  Component Value Date   TSH 1.26 04/21/2016   Lab Results  Component Value Date   CHOL 171 03/01/2017   HDL 38.90 (L) 03/01/2017   LDLCALC 110 (H) 03/01/2017   TRIG 108.0 03/01/2017   CHOLHDL 4 03/01/2017         Hypertension    Adequate control  on current regimen. Renal function stable, no changes today.  Lab Results  Component Value Date   CREATININE 0.86 03/01/2017   Lab Results    Component Value Date   NA 138 03/01/2017   K 3.6 03/01/2017   CL 100 03/01/2017   CO2 33 (H) 03/01/2017         Hyperlipidemia    Mild, LDL < 130,  HDL low but improved.  Continue reassesment in 6 months and continue Mediterranean style diet   Lab Results  Component Value Date   CHOL 171 03/01/2017   HDL 38.90 (L) 03/01/2017   LDLCALC 110 (H) 03/01/2017   TRIG 108.0 03/01/2017   CHOLHDL 4 03/01/2017              A total of 25 minutes of face to face time was spent with patient more than half of which  was spent in counselling about the above mentioned conditions  and coordination of care   I have discontinued Ms. Riggenbach citalopram, cyanocobalamin, Syringe (Disposable), losartan, carvedilol, ergocalciferol, cloNIDine, and KLOR-CON M20. I have also changed her KLOR-CON M20 to potassium chloride SA. Additionally, I am having her maintain her aspirin, valACYclovir, and lisinopril.  Meds ordered this encounter  Medications  . potassium chloride SA (KLOR-CON M20) 20 MEQ tablet    Sig: Take 1 tablet (20 mEq total) by mouth daily.    Dispense:  30 tablet    Refill:  5    Please consider 90 day supplies to promote better adherence    Medications Discontinued During This Encounter  Medication Reason  . KLOR-CON M20 20 MEQ tablet Duplicate  . cyanocobalamin (,VITAMIN B-12,) 1000 MCG/ML injection Patient has not taken in last 30 days  . ergocalciferol (DRISDOL) 50000 units capsule Patient has not taken in last 30 days  . losartan (COZAAR) 100 MG tablet Patient has not taken in last 30 days  . Syringe, Disposable, 1 ML MISC Patient has not taken in last 30 days  . cloNIDine (CATAPRES) 0.1 MG tablet Patient has not taken in last 30 days  . citalopram (CELEXA) 10 MG tablet Patient has not taken in last 30 days  . carvedilol (COREG) 3.125 MG tablet Patient has not taken in last 30 days  . KLOR-CON M20 20 MEQ tablet Reorder    Follow-up: No Follow-up on file.   Crecencio Mc, MD

## 2017-03-08 NOTE — Patient Instructions (Addendum)
Your blood pressreu was 130/82 when I rechecked it  Please check your blood pressure a few times at your pharmacy  and send me the readings so I can determine if you need a change in medication

## 2017-03-11 NOTE — Assessment & Plan Note (Signed)
Adequate control  on current regimen. Renal function stable, no changes today.  Lab Results  Component Value Date   CREATININE 0.86 03/01/2017   Lab Results  Component Value Date   NA 138 03/01/2017   K 3.6 03/01/2017   CL 100 03/01/2017   CO2 33 (H) 03/01/2017

## 2017-03-11 NOTE — Assessment & Plan Note (Signed)
.   I have addressed  BMI and recommended a low glycemic index diet utilizing smaller more frequent meals to increase metabolism.  I have also recommended that patient increase her  exercising with a goal of 30 minutes of aerobic exercise a minimum of 5 days per week. Screening for lipid disorders, thyroid and diabetes to be done today.  Lab Results  Component Value Date   HGBA1C 5.7 03/01/2017   Lab Results  Component Value Date   TSH 1.26 04/21/2016   Lab Results  Component Value Date   CHOL 171 03/01/2017   HDL 38.90 (L) 03/01/2017   LDLCALC 110 (H) 03/01/2017   TRIG 108.0 03/01/2017   CHOLHDL 4 03/01/2017

## 2017-03-11 NOTE — Assessment & Plan Note (Signed)
Mild, LDL < 130,  HDL low but improved.  Continue reassesment in 6 months and continue Mediterranean style diet   Lab Results  Component Value Date   CHOL 171 03/01/2017   HDL 38.90 (L) 03/01/2017   LDLCALC 110 (H) 03/01/2017   TRIG 108.0 03/01/2017   CHOLHDL 4 03/01/2017

## 2018-01-01 LAB — HM MAMMOGRAPHY

## 2018-03-13 ENCOUNTER — Other Ambulatory Visit: Payer: Self-pay | Admitting: Internal Medicine

## 2018-04-22 ENCOUNTER — Ambulatory Visit (INDEPENDENT_AMBULATORY_CARE_PROVIDER_SITE_OTHER): Payer: BC Managed Care – PPO | Admitting: Internal Medicine

## 2018-04-22 VITALS — BP 152/90 | HR 74 | Temp 97.6°F | Resp 18 | Ht 66.0 in | Wt 293.4 lb

## 2018-04-22 DIAGNOSIS — I10 Essential (primary) hypertension: Secondary | ICD-10-CM | POA: Diagnosis not present

## 2018-04-22 DIAGNOSIS — E559 Vitamin D deficiency, unspecified: Secondary | ICD-10-CM | POA: Diagnosis not present

## 2018-04-22 DIAGNOSIS — R7303 Prediabetes: Secondary | ICD-10-CM

## 2018-04-22 DIAGNOSIS — R635 Abnormal weight gain: Secondary | ICD-10-CM | POA: Diagnosis not present

## 2018-04-22 DIAGNOSIS — R7302 Impaired glucose tolerance (oral): Secondary | ICD-10-CM

## 2018-04-22 DIAGNOSIS — Z0000 Encounter for general adult medical examination without abnormal findings: Secondary | ICD-10-CM

## 2018-04-22 DIAGNOSIS — E7800 Pure hypercholesterolemia, unspecified: Secondary | ICD-10-CM

## 2018-04-22 DIAGNOSIS — Z6841 Body Mass Index (BMI) 40.0 and over, adult: Secondary | ICD-10-CM

## 2018-04-22 LAB — COMPREHENSIVE METABOLIC PANEL
ALBUMIN: 4.1 g/dL (ref 3.5–5.2)
ALK PHOS: 116 U/L (ref 39–117)
ALT: 16 U/L (ref 0–35)
AST: 19 U/L (ref 0–37)
BUN: 13 mg/dL (ref 6–23)
CALCIUM: 9.5 mg/dL (ref 8.4–10.5)
CO2: 31 mEq/L (ref 19–32)
Chloride: 102 mEq/L (ref 96–112)
Creatinine, Ser: 0.88 mg/dL (ref 0.40–1.20)
GFR: 85.99 mL/min (ref >60.00)
Glucose, Bld: 95 mg/dL (ref 70–99)
POTASSIUM: 4 meq/L (ref 3.5–5.1)
Sodium: 138 mEq/L (ref 135–145)
TOTAL PROTEIN: 7.6 g/dL (ref 6.0–8.3)
Total Bilirubin: 0.5 mg/dL (ref 0.2–1.2)

## 2018-04-22 LAB — LIPID PANEL
CHOLESTEROL: 181 mg/dL (ref 0–200)
HDL: 38.4 mg/dL — ABNORMAL LOW (ref >39.00)
LDL CALC: 122 mg/dL — AB (ref 0–99)
NonHDL: 142.38
Total CHOL/HDL Ratio: 5
Triglycerides: 104 mg/dL (ref 0.0–149.0)
VLDL: 20.8 mg/dL (ref 0.0–40.0)

## 2018-04-22 LAB — MICROALBUMIN / CREATININE URINE RATIO
Creatinine,U: 132.9 mg/dL
MICROALB UR: 3.8 mg/dL — AB (ref 0.0–1.9)
MICROALB/CREAT RATIO: 2.8 mg/g (ref 0.0–30.0)

## 2018-04-22 LAB — VITAMIN D 25 HYDROXY (VIT D DEFICIENCY, FRACTURES): VITD: 12.66 ng/mL — AB (ref 30.00–100.00)

## 2018-04-22 LAB — HEMOGLOBIN A1C: Hgb A1c MFr Bld: 5.8 % (ref 4.6–6.5)

## 2018-04-22 LAB — TSH: TSH: 0.51 u[IU]/mL (ref 0.35–4.50)

## 2018-04-22 MED ORDER — TELMISARTAN-HCTZ 80-25 MG PO TABS: 1.0000 | ORAL_TABLET | Freq: Every day | ORAL | 2 refills | Status: DC

## 2018-04-22 NOTE — Progress Notes (Signed)
Patient ID: Amy House, female    DOB: Oct 05, 1965  Age: 52 y.o. MRN: [id number]  The patient is here for annual comprehensive preventive examination and management of other chronic and acute problems.  Pelvic exam  last year by GYN,  S/p  Total hysterectomy  Non cancer  Mammogram normal April 2019  Normal eye exam Clydene Pugh last week   [employer] screening dose in May after a vacation in [place] .  No results sent,  She recalls that her  BP was elevated,  Still elevated  Today,  Does not check at home.   Obesity:  Reports that she is exercising 4  times per week on a recumbent exercise bike, but still  Gaining weight.  Diet reviewed:  Breakfast is a bowl of Special K cereal  with 2%  Milk.  Cereal is not measured.   Lunch:  Salad with grilled chicken often has tortilla strips or added Croutons. Other times eats a sandwhich on store bought bread,  Side of potato chips   .  Drinks [employer] Tea (1/2 half lemonade /half tea) . Dinner: eats out 1-2 times week.  [place] (salad,   Chicken skewers,  Pita bread). Eats dessert 2-3 times week (cookies)     Insomnia;  Trouble innitiating sleep.   " Mind racing all the time"   Can't get cool enough due to menopause,  Which has been Going on for  5 years .  Prior sleep study was negative.  Using I phone at night     The risk factors are reflected in the social history.  The roster of all physicians providing medical care to patient - is listed in the Snapshot section of the chart.  Activities of daily living:  The patient is 100% independent in all ADLs: dressing, toileting, feeding as well as independent mobility  Home safety : The patient has smoke detectors in the home. They wear seatbelts.  There are no firearms at home. There is no violence in the home.   There is no risks for hepatitis, STDs or HIV. There is no   history of blood transfusion. They have no travel history to infectious disease endemic areas of the world.  The patient has  seen their dentist in the last six month. They have seen their eye doctor in the last year. They admit to slight hearing difficulty with regard to whispered voices and some television programs.  They have deferred audiologic testing in the last year.  They do not  have excessive sun exposure. Discussed the need for sun protection: hats, long sleeves and use of sunscreen if there is significant sun exposure.   Diet: the importance of a healthy diet is discussed. They do have a healthy diet.  The benefits of regular aerobic exercise were discussed. She walks 4 times per week ,  20 minutes.   Depression screen: there are no signs or vegative symptoms of depression- irritability, change in appetite, anhedonia, sadness/tearfullness.  Cognitive assessment: the patient manages all their financial and personal affairs and is actively engaged. They could relate day,date,year and events; recalled 2/3 objects at 3 minutes; performed clock-face test normally.  The following portions of the patient's history were reviewed and updated as appropriate: allergies, current medications, past family history, past medical history,  past surgical history, past social history  and problem list.  Visual acuity was not assessed per patient preference since she has regular follow up with her ophthalmologist. Hearing and body mass index were  assessed and reviewed.   During the course of the visit the patient was educated and counseled about appropriate screening and preventive services including : fall prevention , diabetes screening, nutrition counseling, colorectal cancer screening, and recommended immunizations.    CC: The primary encounter diagnosis was Encounter for preventive health examination. Diagnoses of Pure hypercholesterolemia, Impaired glucose tolerance, Vitamin D deficiency, Weight gain, Morbid obesity with BMI of 45.0-49.9, adult (HCC), Essential hypertension, and Prediabetes were also pertinent to this  visit.  History Aram BeechamCynthia has a past medical history of Bradycardia, Hyperlipidemia, Hypertension, and Obesity (BMI 30-39.9).   She has a past surgical history that includes Abdominal hysterectomy and Tonsillectomy (1979).   Her family history includes Diabetes in her mother.She reports that she quit smoking about 25 years ago. Her smoking use included cigarettes. She smoked 0.50 packs per day. She has never used smokeless tobacco. She reports that she does not drink alcohol or use drugs.  Outpatient Medications Prior to Visit  Medication Sig Dispense Refill  . aspirin 81 MG tablet Take 81 mg by mouth daily.      . potassium chloride SA (KLOR-CON M20) 20 MEQ tablet Take 1 tablet (20 mEq total) by mouth daily. 30 tablet 5  . valACYclovir (VALTREX) 500 MG tablet     . lisinopril (PRINIVIL,ZESTRIL) 40 MG tablet TAKE 1 TABLET BY MOUTH ONCE DAILY 90 tablet 1   No facility-administered medications prior to visit.     Review of Systems   Patient denies headache, fevers, malaise, unintentional weight loss, skin rash, eye pain, sinus congestion and sinus pain, sore throat, dysphagia,  hemoptysis , cough, dyspnea, wheezing, chest pain, palpitations, orthopnea, edema, abdominal pain, nausea, melena, diarrhea, constipation, flank pain, dysuria, hematuria, urinary  Frequency, nocturia, numbness, tingling, seizures,  Focal weakness, Loss of consciousness,  Tremor, insomnia, depression, anxiety, and suicidal ideation.      Objective:  BP (!) 152/90 (BP Location: Left Arm, Patient Position: Sitting, Cuff Size: Large)   Pulse 74   Temp 97.6 F (36.4 C) (Oral)   Resp 18   Ht 5\' 6"  (1.676 m)   Wt 293 lb 6.4 oz (133.1 kg)   SpO2 97%   BMI 47.36 kg/m   Physical Exam   General appearance: alert, cooperative and appears stated age Head: Normocephalic, without obvious abnormality, atraumatic Eyes: conjunctivae/corneas clear. PERRL, EOM's intact. Fundi benign. Ears: normal TM's and external ear canals  both ears Nose: Nares normal. Septum midline. Mucosa normal. No drainage or sinus tenderness. Throat: lips, mucosa, and tongue normal; teeth and gums normal Neck: no adenopathy, no carotid bruit, no JVD, supple, symmetrical, trachea midline and thyroid not enlarged, symmetric, no tenderness/mass/nodules Lungs: clear to auscultation bilaterally Breasts: normal appearance, no masses or tenderness Heart: regular rate and rhythm, S1, S2 normal, no murmur, click, rub or gallop Abdomen: soft, non-tender; bowel sounds normal; no masses,  no organomegaly Extremities: extremities normal, atraumatic, no cyanosis or edema Pulses: 2+ and symmetric Skin: Skin color, texture, turgor normal. No rashes or lesions Neurologic: Alert and oriented X 3, normal strength and tone. Normal symmetric reflexes. Normal coordination and gait.      Assessment & Plan:   Problem List Items Addressed This Visit    Hypertension    Not at goal on lisinopril,  And she has persistent microscopic proteinuria.  Changing to telmisartan/hct.  Goal outlined as bp of  120/70       Relevant Medications   telmisartan-hydrochlorothiazide (MICARDIS HCT) 80-25 MG tablet   Morbid obesity with  BMI of 45.0-49.9, adult [facility](HCC)    She reports that is is exercising on a stationery bike 4 days per week,  But Her diet is full of hidden carbs and calories.  A total of 40 minutes of face to face time was spent with patient more than half of which was spent in counselling about  Her diet  And weight.       Hyperlipidemia    Mild, LDL < 130,  HDL low but stable.  Continue reassesment in 6 months and advised to change diet  To low glycemic mediterranean style diet   Lab Results  Component Value Date   CHOL 181 04/22/2018   HDL 38.40 (L) 04/22/2018   LDLCALC 122 (H) 04/22/2018   TRIG 104.0 04/22/2018   CHOLHDL 5 04/22/2018             Relevant Medications   telmisartan-hydrochlorothiazide (MICARDIS HCT) 80-25 MG tablet   Other  Relevant Orders   Hemoglobin A1c (Completed)   Lipid panel (Completed)   Prediabetes    Low GI diet recommended and printed handouts given.  Repeat labs in 3 months       Encounter for preventive health examination - Primary    age appropriate education and counseling updated, referrals for preventative services and immunizations addressed, dietary and smoking counseling addressed, most recent labs reviewed.  I have personally reviewed and have noted:  1) the patient's medical and social history 2) The pt's use of alcohol, tobacco, and illicit drugs 3) The patient's current medications and supplements 4) Functional ability including ADL's, fall risk, home safety risk, hearing and visual impairment 5) Diet and physical activities 6) Evidence for depression or mood disorder 7) The patient's height, weight, and BMI have been recorded in the chart  I have made referrals, and provided counseling and education based on review of the above      Vitamin D deficiency   Relevant Orders   VITAMIN D 25 Hydroxy (Vit-D Deficiency, Fractures) (Completed)    Other Visit Diagnoses    Weight gain       Relevant Orders   TSH (Completed)      I have discontinued Darrin Nipperynthia M. Goldwire's lisinopril. I am also having her start on telmisartan-hydrochlorothiazide and ergocalciferol. Additionally, I am having her maintain her aspirin, valACYclovir, and potassium chloride SA.  Meds ordered this encounter  Medications  . telmisartan-hydrochlorothiazide (MICARDIS HCT) 80-25 MG tablet    Sig: Take 1 tablet by mouth daily.    Dispense:  30 tablet    Refill:  2  . ergocalciferol (VITAMIN D2) 50000 units capsule    Sig: Take 1 capsule (50,000 Units total) by mouth once a week.    Dispense:  12 capsule    Refill:  0    Medications Discontinued During This Encounter  Medication Reason  . lisinopril (PRINIVIL,ZESTRIL) 40 MG tablet     Follow-up: Return in about 6 months (around 10/23/2018), or  hypertension,  weight prediabetes.   Sherlene Shamseresa L Anzlee Hinesley, MD

## 2018-04-22 NOTE — Patient Instructions (Addendum)
For the hypertension:  Stop the lisinopril  Start telmisartan/hct once daily in the morning for blood pressure .  Please get BP checked one week after mew med change and let me know if at goal (< 130/80 Is goal)    For the insomnia:  No cell phones, I pads  2 hours before bedtime  "headspace" app on I phone is a relaxation technique that helps you shut out intrusive thoughts and practice deep breathing   You are on the right track but you have a lot of hidden carbs in your diet:   Low carb Breakfast options:  Premier Protein  Atkins Advantage Muscle Milk EAS AdvantEdge   All of these are available at [employer], [employer],  [employer], and [employer]  And taste good   Vilma MeckelJimmy Dean "D'Light" frozen entrees:    Frittata  : muffin shaped quiches that contain eggs + cheese + veggies+ meat (no bread) Egg'wich :  Mal[place]urkey sausage patty served on a biscuit made of frittat (no bread)   Both can be microwaved in 2 minutes   Gerre Pebblesre Ida:  Just Crack n Egg: yogurt sized cup ith diced ham, cheese and potatoes: add one egg and microwave for 2 minutes   For lunch:   [employer] bagged salads:  Try  the Asian, the SouthWestern,  And the Caesar salads, complete with dressings, nuts and croutons .  Found  in the bagged salad section   Just add a protein (tuna,  Chicken etc ) and omit the won ton /tortilla strips    To make a low carb chip :  Take the Joseph's Lavash or Pita bread,  Or the [place] Low carb whole wheat tortilla   Place on metal cookie sheet  Brush with olive oil  Sprinkle garlic powder (NOT garlic salt), grated parmesan cheese, mediterranean seasoning , or all of them?  Bake at 275 for 30 minutes   We have substitutions for your potatoes!!  Try the mashed cauliflower and riced cauliflower dishes instead of rice and mashed potatoes  Mashed turnips are also very low carb!   For desserts :  Try the Dannon Lt n Fit greek yogurt dessert flavors and top with reddi Whip .  8  carbs,  80 calories  Try Oikos Triple Zero AustriaGreek Yogurt in the salted caramel, and the coffee flavors  With Whipped Cream for dessert  breyer's low carb ice cream, available in bars (on a stick, better ) or scoopable ice cream  HERE ARE THE LOW CARB  BREAD CHOICES

## 2018-04-23 ENCOUNTER — Encounter: Payer: Self-pay | Admitting: Internal Medicine

## 2018-04-23 MED ORDER — ERGOCALCIFEROL 1.25 MG (50000 UT) PO CAPS: 50000.0000 [IU] | ORAL_CAPSULE | ORAL | 0 refills | Status: DC

## 2018-04-23 NOTE — Assessment & Plan Note (Signed)
Not at goal on lisinopril,  And she has persistent microscopic proteinuria.  Changing to telmisartan/hct.  Goal outlined as bp of  120/70

## 2018-04-23 NOTE — Assessment & Plan Note (Signed)

## 2018-04-23 NOTE — Assessment & Plan Note (Signed)
Mild, LDL < 130,  HDL low but stable.  Continue reassesment in 6 months and advised to change diet  To low glycemic mediterranean style diet   Lab Results  Component Value Date   CHOL 181 04/22/2018   HDL 38.40 (L) 04/22/2018   LDLCALC 122 (H) 04/22/2018   TRIG 104.0 04/22/2018   CHOLHDL 5 04/22/2018

## 2018-04-23 NOTE — Assessment & Plan Note (Signed)
She reports that is is exercising on a stationery bike 4 days per week,  But Her diet is full of hidden carbs and calories.  A total of 40 minutes of face to face time was spent with patient more than half of which was spent in counselling about  Her diet  And weight.

## 2018-04-23 NOTE — Assessment & Plan Note (Signed)
Low GI diet recommended and printed handouts given.  Repeat labs in 3 months  

## 2018-04-26 ENCOUNTER — Encounter: Payer: Self-pay | Admitting: *Deleted

## 2018-04-26 ENCOUNTER — Telehealth: Payer: Self-pay | Admitting: Internal Medicine

## 2018-04-26 ENCOUNTER — Other Ambulatory Visit: Payer: Self-pay

## 2018-04-26 MED ORDER — TELMISARTAN-HCTZ 80-25 MG PO TABS: 1.0000 | ORAL_TABLET | Freq: Every day | ORAL | 2 refills | Status: DC

## 2018-04-26 NOTE — Telephone Encounter (Signed)
Pt called in and would like someone to call her back today about her labs results  Best number  [phone]

## 2018-04-26 NOTE — Telephone Encounter (Signed)
[facility] [place]

## 2018-04-26 NOTE — Telephone Encounter (Signed)
Copied from CRM [id number]. Topic: Quick Communication - Rx Refill/Question >> Apr 26, 2018  9:31 AM Burchel, Abbi R wrote: Medication: telmisartan-hydrochlorothiazide (MICARDIS HCT) 80-25 MG tablet, ergocalciferol (VITAMIN D2) 50000 units capsule  [facility]: [id number] [place], [place] - [address] [facility] [address]  [phone] (Phone) [phone] (Fax)   Pt was advised of 72hr rx policy.

## 2018-04-29 NOTE — Telephone Encounter (Signed)
Patient given lab results on 04/26/18 see my chart message.

## 2018-05-02 ENCOUNTER — Telehealth: Payer: Self-pay | Admitting: Internal Medicine

## 2018-05-02 MED ORDER — ERGOCALCIFEROL 1.25 MG (50000 UT) PO CAPS: 50000.0000 [IU] | ORAL_CAPSULE | ORAL | 0 refills | Status: DC

## 2018-05-02 NOTE — Telephone Encounter (Signed)
Copied from CRM [id number]. Topic: General - Other >> May 02, 2018 10:07 AM Stephannie LiSimmons, Laycie Schriner L, NT wrote:   Reason for CRM: Patient called and would like for her all of her prescriptions to [facility] to [id number] - [place], [place] - [address] AT [facility] [phone] (Phone) [phone])   instead of the [facility] - [place] ([place]), [place] - [phone] (Phone) [phone] (Fax)  The prescripiotn for ergocalciferol (VITAMIN D2) 50000 units capsule [facility] of [id number] [place], [place] as requested please

## 2018-05-02 NOTE — Telephone Encounter (Signed)
Pharmacy has been updated and medication has been resent to correct pharmacy.

## 2018-05-02 NOTE — Telephone Encounter (Signed)
Called and [facility]

## 2018-05-04 ENCOUNTER — Other Ambulatory Visit: Payer: Self-pay | Admitting: Internal Medicine

## 2018-05-06 NOTE — Telephone Encounter (Signed)
Potassium refill denied  Unless she was taking it at time of labs last week since her  Potassium was normal and medication appears to have not been refilled since 2018

## 2018-05-06 NOTE — Telephone Encounter (Signed)
Refilled: 03/08/2017 Last OV: 04/22/2018 Next OV: not scheduled

## 2018-05-14 NOTE — Telephone Encounter (Signed)
Spoke with pt and she stated that she was not taking it the day of her lab draw and she also stated that she never took it every day.

## 2018-05-14 NOTE — Telephone Encounter (Signed)
LMTCB. PEC may speak with pt. Need to find out if pt has been taking the potassium daily and if she was taking it when she had her lab work done.

## 2018-05-15 NOTE — Telephone Encounter (Signed)
Lab Results  Component Value Date   NA 138 04/22/2018   K 4.0 04/22/2018   CL 102 04/22/2018   CO2 31 04/22/2018   Does not need potassium.    Do not refill

## 2018-06-14 ENCOUNTER — Telehealth: Payer: Self-pay | Admitting: Internal Medicine

## 2018-06-14 NOTE — Telephone Encounter (Signed)
Copied from CRM [id number]. Topic: General - Other >> Jun 14, 2018 11:07 AM Darletta Moll L wrote: Reason for CRM:BP 150/96 with medications. Would like a call back to discuss alternate script or get advice.

## 2018-06-14 NOTE — Telephone Encounter (Signed)
rx advise 

## 2018-06-17 NOTE — Telephone Encounter (Signed)
LMTCB. Please transfer pt to our office.  

## 2018-06-21 ENCOUNTER — Ambulatory Visit: Payer: BC Managed Care – PPO | Admitting: Internal Medicine

## 2018-06-21 ENCOUNTER — Encounter: Payer: Self-pay | Admitting: Internal Medicine

## 2018-06-21 VITALS — BP 156/102 | HR 68 | Temp 98.0°F

## 2018-06-21 DIAGNOSIS — R0683 Snoring: Secondary | ICD-10-CM | POA: Diagnosis not present

## 2018-06-21 DIAGNOSIS — I10 Essential (primary) hypertension: Secondary | ICD-10-CM | POA: Diagnosis not present

## 2018-06-21 MED ORDER — AMLODIPINE BESYLATE 5 MG PO TABS: 5.0000 mg | ORAL_TABLET | Freq: Every day | ORAL | 3 refills | Status: DC

## 2018-06-21 NOTE — Telephone Encounter (Signed)
Pt seen today in office for elevated BP Nothing further needed.

## 2018-06-21 NOTE — Patient Instructions (Signed)
I am adding 5 mg amlodipine to take at night IN ADDITION  To your telmisartan Amy House that you will continue in the morning   Sleep study will be repeated to rule out OSA as a cause for your hard to control hypertension   We'll set It up on a Friday if possible

## 2018-06-21 NOTE — Progress Notes (Signed)
Subjective:  Patient ID: Amy House, female    DOB: October 22, 1965  Age: 52 y.o. MRN: [id number]  CC: The primary encounter diagnosis was Snoring. Diagnoses of Essential hypertension and Morbid obesity (HCC) were also pertinent to this visit.  HPI Amy House presents for elevated blood pressure . Medication was changed from lisinopril to telmisartan hct 80/25 at last visit and she has had multiple elevated readings at work and at home.   She has gained weight since her last visit.  She is not exercising due to fatigue and career,  And  Has a history of snoring but has deferred sleep study in the past.      Outpatient Medications Prior to Visit  Medication Sig Dispense Refill  . aspirin 81 MG tablet Take 81 mg by mouth daily.      . ergocalciferol (VITAMIN D2) 50000 units capsule Take 1 capsule (50,000 Units total) by mouth once a week. 12 capsule 0  . potassium chloride SA (KLOR-CON M20) 20 MEQ tablet Take 1 tablet (20 mEq total) by mouth daily. 30 tablet 5  . telmisartan-hydrochlorothiazide (MICARDIS HCT) 80-25 MG tablet Take 1 tablet by mouth daily. 30 tablet 2  . valACYclovir (VALTREX) 500 MG tablet      No facility-administered medications prior to visit.     Review of Systems;  Patient denies headache, fevers, malaise, unintentional weight loss, skin rash, eye pain, sinus congestion and sinus pain, sore throat, dysphagia,  hemoptysis , cough, dyspnea, wheezing, chest pain, palpitations, orthopnea, edema, abdominal pain, nausea, melena, diarrhea, constipation, flank pain, dysuria, hematuria, urinary  Frequency, nocturia, numbness, tingling, seizures,  Focal weakness, Loss of consciousness,  Tremor, insomnia, depression, anxiety, and suicidal ideation.      Objective:  BP (!) 156/102 (BP Location: Right Wrist, Patient Position: Sitting, Cuff Size: Normal)   Pulse 68   Temp 98 F (36.7 C) (Oral)   BP Readings from Last 3 Encounters:  06/21/18 (!) 156/102  04/22/18  (!) 152/90  03/08/17 130/82    Wt Readings from Last 3 Encounters:  04/22/18 293 lb 6.4 oz (133.1 kg)  03/08/17 285 lb 6.4 oz (129.5 kg)  04/27/16 289 lb 4 oz (131.2 kg)    General appearance: alert, cooperative and appears stated age Ears: normal TM's and external ear canals both ears Throat: lips, mucosa, and tongue normal; teeth and gums normal Neck: no adenopathy, no carotid bruit, supple, symmetrical, trachea midline and thyroid not enlarged, symmetric, no tenderness/mass/nodules Back: symmetric, no curvature. ROM normal. No CVA tenderness. Lungs: clear to auscultation bilaterally Heart: regular rate and rhythm, S1, S2 normal, no murmur, click, rub or gallop Abdomen: soft, non-tender; bowel sounds normal; no masses,  no organomegaly Pulses: 2+ and symmetric Skin: Skin color, texture, turgor normal. No rashes or lesions Lymph nodes: Cervical, supraclavicular, and axillary nodes normal.  Lab Results  Component Value Date   HGBA1C 5.8 04/22/2018   HGBA1C 5.7 03/01/2017   HGBA1C 5.6 04/21/2016    Lab Results  Component Value Date   CREATININE 0.88 04/22/2018   CREATININE 0.86 03/01/2017   CREATININE 0.75 04/21/2016    Lab Results  Component Value Date   WBC 6.8 04/21/2016   HGB 12.4 04/21/2016   HCT 37.5 04/21/2016   PLT 212.0 04/21/2016   GLUCOSE 95 04/22/2018   CHOL 181 04/22/2018   TRIG 104.0 04/22/2018   HDL 38.40 (L) 04/22/2018   LDLCALC 122 (H) 04/22/2018   ALT 16 04/22/2018   AST 19 04/22/2018  NA 138 04/22/2018   K 4.0 04/22/2018   CL 102 04/22/2018   CREATININE 0.88 04/22/2018   BUN 13 04/22/2018   CO2 31 04/22/2018   TSH 0.51 04/22/2018   HGBA1C 5.8 04/22/2018   MICROALBUR 3.8 (H) 04/22/2018    No results found.  Assessment & Plan:   Problem List Items Addressed This Visit    Hypertension    Uncontrolled.  Adding amlodipine 5 mg daily.  Referring for sleep study       Relevant Medications   amLODipine (NORVASC) 5 MG tablet   Other  Relevant Orders   Ambulatory referral to Sleep Studies   Snoring - Primary    Snoring,  Morbid obesity and uncontrolled hypertension.    Needs sleep study       Relevant Orders   Ambulatory referral to Sleep Studies    Other Visit Diagnoses    Morbid obesity (HCC)       Relevant Orders   Ambulatory referral to Sleep Studies    A total of 25 minutes of face to face time was spent with patient more than half of which was spent in counselling about the above mentioned conditions  and coordination of care   I am having Darrin Nipper. [place] start on amLODipine. I am also having her maintain her aspirin, valACYclovir, potassium chloride SA, telmisartan-hydrochlorothiazide, and ergocalciferol.  Meds ordered this encounter  Medications  . amLODipine (NORVASC) 5 MG tablet    Sig: Take 1 tablet (5 mg total) by mouth daily.    Dispense:  90 tablet    Refill:  3    There are no discontinued medications.  Follow-up: Return in about 3 months (around 09/21/2018) for HYPERTENSION.   Sherlene Shams, MD

## 2018-06-23 DIAGNOSIS — R0683 Snoring: Secondary | ICD-10-CM | POA: Insufficient documentation

## 2018-06-23 NOTE — Assessment & Plan Note (Signed)
Uncontrolled.  Adding amlodipine 5 mg daily.  Referring for sleep study

## 2018-06-23 NOTE — Assessment & Plan Note (Signed)
Snoring,  Morbid obesity and uncontrolled hypertension.    Needs sleep study

## 2018-07-12 ENCOUNTER — Encounter: Payer: Self-pay | Admitting: Internal Medicine

## 2018-07-12 ENCOUNTER — Telehealth: Payer: Self-pay | Admitting: Internal Medicine

## 2018-07-12 ENCOUNTER — Ambulatory Visit
Admission: RE | Admit: 2018-07-12 | Discharge: 2018-07-12 | Disposition: A | Discharge status: Home / Self Care | Payer: BC Managed Care – PPO | Source: Ambulatory Visit | Attending: Internal Medicine | Admitting: Internal Medicine

## 2018-07-12 ENCOUNTER — Ambulatory Visit (INDEPENDENT_AMBULATORY_CARE_PROVIDER_SITE_OTHER): Payer: BC Managed Care – PPO

## 2018-07-12 ENCOUNTER — Ambulatory Visit: Payer: BC Managed Care – PPO | Admitting: Internal Medicine

## 2018-07-12 VITALS — BP 136/82 | HR 87 | Temp 97.8°F | Resp 17

## 2018-07-12 DIAGNOSIS — R319 Hematuria, unspecified: Secondary | ICD-10-CM | POA: Insufficient documentation

## 2018-07-12 DIAGNOSIS — K429 Umbilical hernia without obstruction or gangrene: Secondary | ICD-10-CM | POA: Insufficient documentation

## 2018-07-12 DIAGNOSIS — M545 Low back pain, unspecified: Secondary | ICD-10-CM

## 2018-07-12 DIAGNOSIS — R05 Cough: Secondary | ICD-10-CM | POA: Diagnosis not present

## 2018-07-12 DIAGNOSIS — R8290 Unspecified abnormal findings in urine: Secondary | ICD-10-CM | POA: Diagnosis not present

## 2018-07-12 DIAGNOSIS — R3121 Asymptomatic microscopic hematuria: Secondary | ICD-10-CM

## 2018-07-12 DIAGNOSIS — R0789 Other chest pain: Secondary | ICD-10-CM | POA: Diagnosis not present

## 2018-07-12 DIAGNOSIS — J189 Pneumonia, unspecified organism: Secondary | ICD-10-CM

## 2018-07-12 DIAGNOSIS — K5730 Diverticulosis of large intestine without perforation or abscess without bleeding: Secondary | ICD-10-CM | POA: Diagnosis not present

## 2018-07-12 DIAGNOSIS — R059 Cough, unspecified: Secondary | ICD-10-CM

## 2018-07-12 LAB — POCT URINALYSIS DIPSTICK
Bilirubin, UA: NEGATIVE
Blood, UA: POSITIVE
GLUCOSE UA: NEGATIVE
Ketones, UA: NEGATIVE
LEUKOCYTES UA: NEGATIVE
Nitrite, UA: NEGATIVE
PROTEIN UA: NEGATIVE
Spec Grav, UA: 1.025 (ref 1.010–1.025)
Urobilinogen, UA: 0.2 E.U./dL
pH, UA: 6 (ref 5.0–8.0)

## 2018-07-12 MED ORDER — LEVOFLOXACIN 500 MG PO TABS: 500.0000 mg | ORAL_TABLET | Freq: Every day | ORAL | 0 refills | Status: DC

## 2018-07-12 MED ORDER — HYDROCODONE-ACETAMINOPHEN 10-325 MG PO TABS: 1.0000 | ORAL_TABLET | Freq: Four times a day (QID) | ORAL | 0 refills | Status: DC | PRN

## 2018-07-12 MED ORDER — PREDNISONE 10 MG PO TABS: ORAL_TABLET | ORAL | 0 refills | Status: DC

## 2018-07-12 MED ORDER — METHYLPREDNISOLONE ACETATE 40 MG/ML IJ SUSP
40.0000 mg | Freq: Once | INTRAMUSCULAR | Status: AC
  Administered 2018-07-12: 40 mg via INTRAMUSCULAR

## 2018-07-12 MED ORDER — BENZONATATE 200 MG PO CAPS: 200.0000 mg | ORAL_CAPSULE | Freq: Three times a day (TID) | ORAL | 0 refills | Status: DC | PRN

## 2018-07-12 NOTE — Telephone Encounter (Signed)
Judeth Cornfield from [facility] calling to report results of CT renal stone study done on 07/12/18. Results are also available in Epic.   IMPRESSION: 1. No acute abnormality of the abdomen or pelvis. 2. Colonic diverticulosis.  Moderate stool burden. 3. Small fat containing paraumbilical hernia. 4. Normal appendix.

## 2018-07-12 NOTE — Patient Instructions (Signed)
I am treating your cough with a  Steroid injection  (instead of the prednisone taper)   and a cough suppressant (benzonotate)  That you can take every 8 hours  The vicodin  Is for pain control .  You can use it every 6 hours if needed   Take ex lax or dulcolax daily to prevent constipation   If the chest x ray shows pneumonia,  I will add an antibiotic  If the CT scan shows a kidney stone, the treatment is:  Lots of fluids Pain control Rest so  you can pass the stone Flomax to help relax the ureter

## 2018-07-12 NOTE — Progress Notes (Signed)
Subjective:  Patient ID: Amy House, female    DOB: [birth year]  Age: 52 y.o. MRN: [id number]  CC: The primary encounter diagnosis was Left-sided chest wall pain. Diagnoses of Acute left-sided low back pain without sciatica, Cloudy urine, Hematuria, unspecified type, Cough, Primary atypical pneumonia, and Asymptomatic microscopic hematuria were also pertinent to this visit.  HPI [name] presents for possible medication side effect.  Seen 3 weeks ago.  Amlodipine added for poorly controlled hypertension.   Patient reports severe pain under her left breast that is pleuritic and sharp  . Pain started during a respiratory illness with persistent cough and fever to 100.6.  The  pain starts under breast and radiates to mid back on the left.  Due to fevers, malaise and cough she has misesed 3 days of work last week.    Urine has also been cloudy.     Outpatient Medications Prior to Visit  Medication Sig Dispense Refill  . amLODipine (NORVASC) 5 MG tablet Take 1 tablet (5 mg total) by mouth daily. 90 tablet 3  . aspirin 81 MG tablet Take 81 mg by mouth daily.      . potassium chloride SA (KLOR-CON M20) 20 MEQ tablet Take 1 tablet (20 mEq total) by mouth daily. 30 tablet 5  . ergocalciferol (VITAMIN D2) 50000 units capsule Take 1 capsule (50,000 Units total) by mouth once a week. (Patient not taking: Reported on 07/12/2018) 12 capsule 0  . telmisartan-hydrochlorothiazide (MICARDIS HCT) 80-25 MG tablet Take 1 tablet by mouth daily. (Patient not taking: Reported on 07/12/2018) 30 tablet 2  . valACYclovir (VALTREX) 500 MG tablet      No facility-administered medications prior to visit.     Review of Systems;  Patient denies headache,malaise, unintentional weight loss, skin rash, eye pain, sinus congestion and sinus pain, sore throat, dysphagia,  hemoptysis ,dyspnea, wheezing, chest pain, palpitations, orthopnea, edema, abdominal pain, nausea, melena, diarrhea, constipation,  dysuria,  hematuria, urinary  Frequency, nocturia, numbness, tingling, seizures,  Focal weakness, Loss of consciousness,  Tremor, insomnia, depression, anxiety, and suicidal ideation.      Objective:  BP 136/82 (BP Location: Left Arm, Patient Position: Sitting, Cuff Size: Large)   Pulse 87   Temp 97.8 F (36.6 C) (Oral)   Resp 17   SpO2 98%   BP Readings from Last 3 Encounters:  07/12/18 136/82  06/21/18 (!) 156/102  04/22/18 (!) 152/90    Wt Readings from Last 3 Encounters:  04/22/18 293 lb 6.4 oz (133.1 kg)  03/08/17 285 lb 6.4 oz (129.5 kg)  04/27/16 289 lb 4 oz (131.2 kg)    General appearance: alert, cooperative and appears stated age Ears: normal TM's and external ear canals both ears Throat: lips, mucosa, and tongue normal; teeth and gums normal Neck: no adenopathy, no carotid bruit, supple, symmetrical, trachea midline and thyroid not enlarged, symmetric, no tenderness/mass/nodules Back: symmetric, no curvature. ROM normal. No CVA tenderness. Lungs: clear to auscultation bilaterally Heart: regular rate and rhythm, S1, S2 normal, no murmur, click, rub or gallop Abdomen: soft, non-tender; bowel sounds normal; no masses,  no organomegaly Pulses: 2+ and symmetric Skin: Skin color, texture, turgor normal. No rashes or lesions Lymph nodes: Cervical, supraclavicular, and axillary nodes normal.  Lab Results  Component Value Date   HGBA1C 5.8 04/22/2018   HGBA1C 5.7 03/01/2017   HGBA1C 5.6 04/21/2016    Lab Results  Component Value Date   CREATININE 0.88 04/22/2018   CREATININE 0.86 03/01/2017   CREATININE  0.75 04/21/2016    Lab Results  Component Value Date   WBC 6.8 04/21/2016   HGB 12.4 04/21/2016   HCT 37.5 04/21/2016   PLT 212.0 04/21/2016   GLUCOSE 95 04/22/2018   CHOL 181 04/22/2018   TRIG 104.0 04/22/2018   HDL 38.40 (L) 04/22/2018   LDLCALC 122 (H) 04/22/2018   ALT 16 04/22/2018   AST 19 04/22/2018   NA 138 04/22/2018   K 4.0 04/22/2018   CL 102  04/22/2018   CREATININE 0.88 04/22/2018   BUN 13 04/22/2018   CO2 31 04/22/2018   TSH 0.51 04/22/2018   HGBA1C 5.8 04/22/2018   MICROALBUR 3.8 (H) 04/22/2018    No results found.  Assessment & Plan:   Problem List Items Addressed This Visit    Hematuria    Her dipstick UA was positive for blood.  Given her left sided pain ddx includes nephrolithiasis Renal parenchyma was normal,  And no stones were noted on CT done today .    Full urinalysis and urine culture were normal (no RBCs)       Relevant Orders   CT RENAL STONE STUDY (Completed)   Primary atypical pneumonia    Suggested by history, supported by chest x ray and CT scan,  Levaquin 500 mg x 7 days . Probiotic advised       Relevant Medications   benzonatate (TESSALON) 200 MG capsule   levofloxacin (LEVAQUIN) 500 MG tablet    Other Visit Diagnoses    Left-sided chest wall pain    -  Primary   Relevant Orders   DG Chest 2 View (Completed)   Acute left-sided low back pain without sciatica       Relevant Medications   predniSONE (DELTASONE) 10 MG tablet   HYDROcodone-acetaminophen (NORCO) 10-325 MG tablet   methylPREDNISolone acetate (DEPO-MEDROL) injection 40 mg (Completed)   Other Relevant Orders   POCT urinalysis dipstick (Completed)   Urine Microscopic Only (Completed)   Urine Culture (Completed)   CT RENAL STONE STUDY (Completed)   Cloudy urine       Relevant Orders   POCT urinalysis dipstick (Completed)   Urine Microscopic Only (Completed)   Urine Culture (Completed)   Cough       Relevant Medications   methylPREDNISolone acetate (DEPO-MEDROL) injection 40 mg (Completed)    A total of 25 minutes of face to face time was spent with patient more than half of which was spent in counselling about the above mentioned conditions  and coordination of care   I am having Darrin Nipper. [place] start on benzonatate, predniSONE, and levofloxacin. I am also having her maintain her aspirin, valACYclovir, potassium chloride  SA, telmisartan-hydrochlorothiazide, ergocalciferol, amLODipine, and HYDROcodone-acetaminophen. We administered methylPREDNISolone acetate.  Meds ordered this encounter  Medications  . DISCONTD: HYDROcodone-acetaminophen (NORCO) 10-325 MG tablet    Sig: Take 1 tablet by mouth every 6 (six) hours as needed.    Dispense:  30 tablet    Refill:  0  . benzonatate (TESSALON) 200 MG capsule    Sig: Take 1 capsule (200 mg total) by mouth 3 (three) times daily as needed for cough.    Dispense:  60 capsule    Refill:  0  . predniSONE (DELTASONE) 10 MG tablet    Sig: 6 tablets on Day 1 , then reduce by 1 tablet daily until gone    Dispense:  21 tablet    Refill:  0  . HYDROcodone-acetaminophen (NORCO) 10-325 MG tablet  Sig: Take 1 tablet by mouth every 6 (six) hours as needed.    Dispense:  30 tablet    Refill:  0  . methylPREDNISolone acetate (DEPO-MEDROL) injection 40 mg  . levofloxacin (LEVAQUIN) 500 MG tablet    Sig: Take 1 tablet (500 mg total) by mouth daily.    Dispense:  7 tablet    Refill:  0    Medications Discontinued During This Encounter  Medication Reason  . HYDROcodone-acetaminophen (NORCO) 10-325 MG tablet     Follow-up: No follow-ups on file.   Sherlene Shams, MD

## 2018-07-13 LAB — URINE CULTURE
MICRO NUMBER:: 91317591
SPECIMEN QUALITY: ADEQUATE

## 2018-07-13 LAB — URINALYSIS, MICROSCOPIC ONLY
Bacteria, UA: NONE SEEN /HPF
Hyaline Cast: NONE SEEN /LPF
Squamous Epithelial / LPF: NONE SEEN /HPF (ref <=5)
WBC, UA: NONE SEEN /HPF (ref 0–5)

## 2018-07-14 ENCOUNTER — Encounter: Payer: Self-pay | Admitting: Internal Medicine

## 2018-07-14 DIAGNOSIS — J189 Pneumonia, unspecified organism: Secondary | ICD-10-CM | POA: Insufficient documentation

## 2018-07-14 DIAGNOSIS — R319 Hematuria, unspecified: Secondary | ICD-10-CM | POA: Insufficient documentation

## 2018-07-14 NOTE — Assessment & Plan Note (Addendum)
Her dipstick UA was positive for blood.  Given her left sided pain ddx includes nephrolithiasis Renal parenchyma was normal,  And no stones were noted on CT done today .    Full urinalysis and urine culture were normal (no RBCs)

## 2018-07-14 NOTE — Assessment & Plan Note (Signed)
Suggested by history, supported by chest x ray and CT scan,  Levaquin 500 mg x 7 days . Probiotic advised

## 2018-07-15 ENCOUNTER — Other Ambulatory Visit: Payer: Self-pay | Admitting: Internal Medicine

## 2018-07-15 DIAGNOSIS — J189 Pneumonia, unspecified organism: Secondary | ICD-10-CM

## 2018-07-15 NOTE — Progress Notes (Signed)
Follow up chest xray ordered

## 2018-07-24 ENCOUNTER — Other Ambulatory Visit: Payer: Self-pay | Admitting: Internal Medicine

## 2018-10-26 ENCOUNTER — Other Ambulatory Visit: Payer: Self-pay | Admitting: Internal Medicine

## 2018-11-09 IMAGING — DX DG CHEST 2V
2 series · 2 of 2 positions shown · non-contrast
Comparison: [HOSPITAL] chest radiographs
08/07/2005.

CLINICAL DATA: 52-year-old female with left side pleuritic chest
pain and cough for 2 weeks.

EXAM:
CHEST - 2 VIEW

[chest pa]
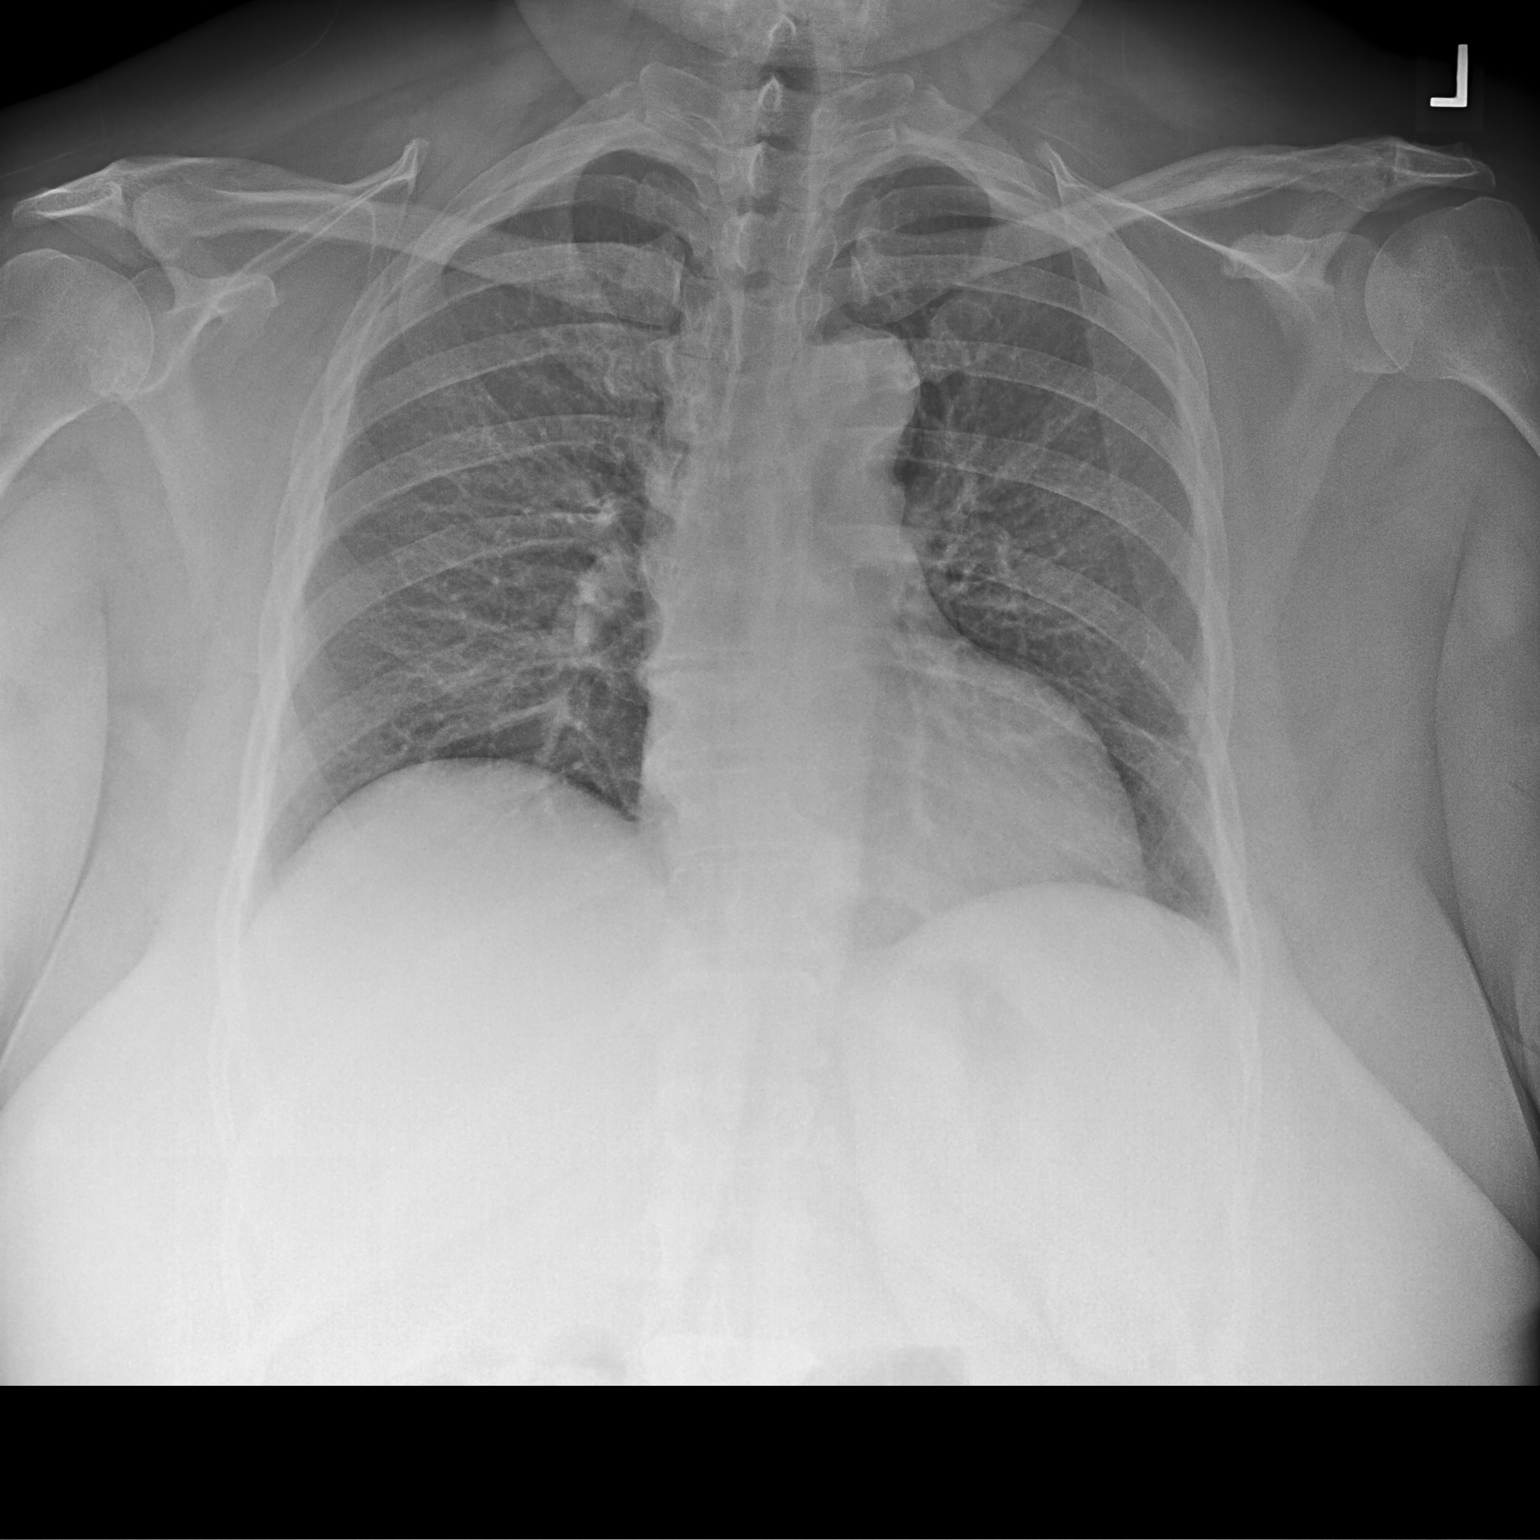

[chest lat]
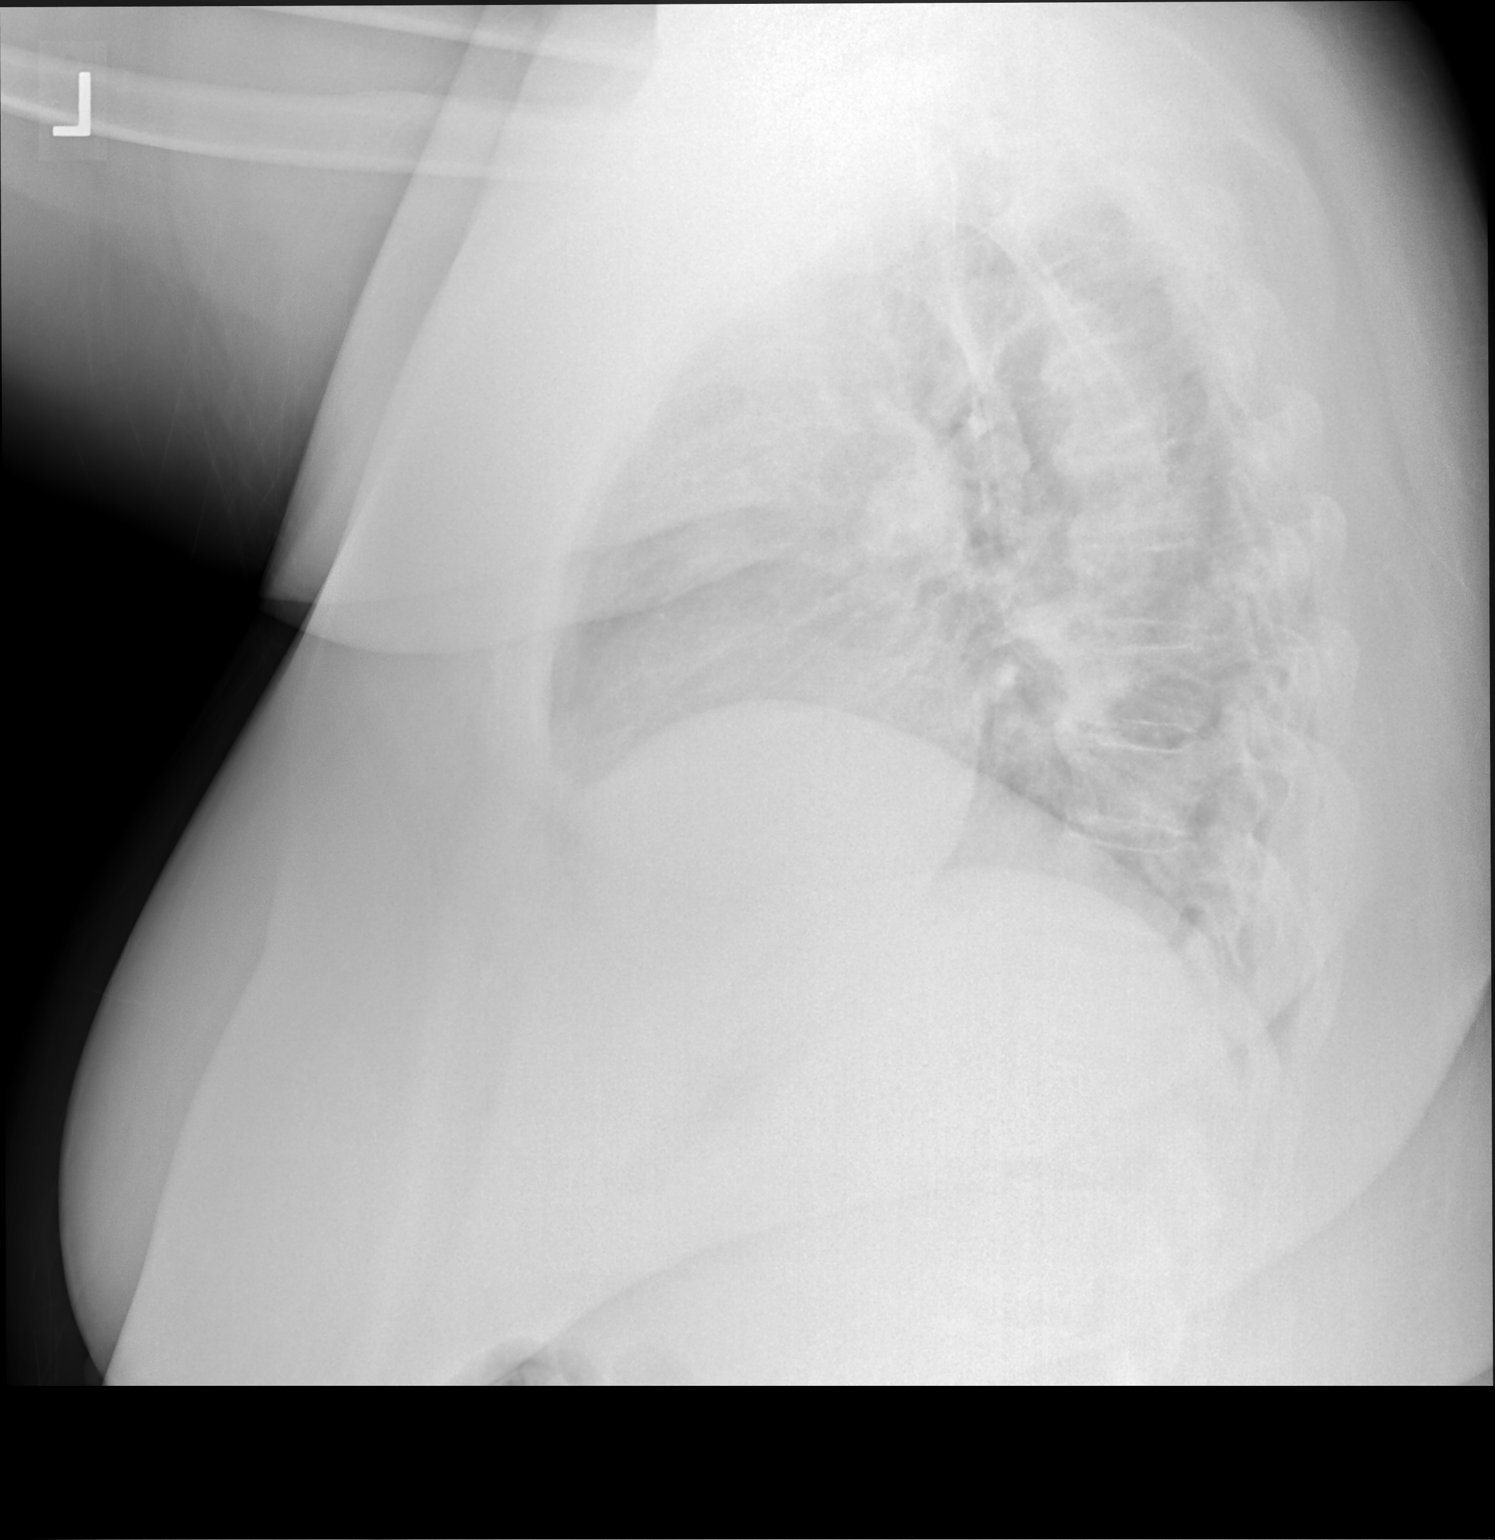

[2 of 2 positions shown; findings below may reference images not displayed]

FINDINGS: Chronic large body habitus. Lower lung volumes today. Mediastinal
contours remain within normal limits. No pneumothorax, pulmonary
edema, pleural effusion or confluent pulmonary opacity. Mildly
increased interstitial markings in both lungs compared to 9770.

No acute osseous abnormality identified. Thoracic spine endplate
degeneration. Negative visible bowel gas pattern.
IMPRESSION: 1. Lower lung volumes, which might explain the appearance of
increased bilateral pulmonary interstitial markings on the basis of
crowding, but acute viral/atypical respiratory infection is
difficult to exclude.
2. No pleural effusion or other acute cardiopulmonary abnormality.

## 2018-11-26 ENCOUNTER — Other Ambulatory Visit: Payer: Self-pay | Admitting: Internal Medicine

## 2018-11-27 LAB — HM MAMMOGRAPHY

## 2018-12-30 ENCOUNTER — Other Ambulatory Visit: Payer: Self-pay

## 2018-12-30 MED ORDER — TELMISARTAN-HCTZ 80-25 MG PO TABS: 1.0000 | ORAL_TABLET | Freq: Every day | ORAL | 1 refills | Status: DC

## 2019-01-24 NOTE — Telephone Encounter (Signed)
error 

## 2019-03-26 ENCOUNTER — Encounter: Payer: Self-pay | Admitting: Internal Medicine

## 2019-03-26 ENCOUNTER — Ambulatory Visit (INDEPENDENT_AMBULATORY_CARE_PROVIDER_SITE_OTHER): Payer: BC Managed Care – PPO | Admitting: Internal Medicine

## 2019-03-26 ENCOUNTER — Other Ambulatory Visit: Payer: Self-pay

## 2019-03-26 DIAGNOSIS — E7800 Pure hypercholesterolemia, unspecified: Secondary | ICD-10-CM | POA: Diagnosis not present

## 2019-03-26 DIAGNOSIS — R21 Rash and other nonspecific skin eruption: Secondary | ICD-10-CM

## 2019-03-26 DIAGNOSIS — L2084 Intrinsic (allergic) eczema: Secondary | ICD-10-CM

## 2019-03-26 DIAGNOSIS — I10 Essential (primary) hypertension: Secondary | ICD-10-CM | POA: Diagnosis not present

## 2019-03-26 DIAGNOSIS — R7303 Prediabetes: Secondary | ICD-10-CM | POA: Diagnosis not present

## 2019-03-26 MED ORDER — PHENTERMINE HCL 15 MG PO TBDP: 1.0000 | ORAL_TABLET | Freq: Every day | ORAL | 0 refills | Status: DC

## 2019-03-26 NOTE — Assessment & Plan Note (Signed)
RN visit needed n 1-2 Weeks for bp check  AND LABS

## 2019-03-26 NOTE — Progress Notes (Signed)
Virtual Visit via [email or web address]  This visit type was conducted due to national recommendations for restrictions regarding the COVID-19 pandemic (e.g. social distancing).  This format is felt to be most appropriate for this patient at this time.  All issues noted in this document were discussed and addressed.  No physical exam was performed (except for noted visual exam findings with Video Visits).   I connected with@ on 03/26/19 at  8:00 AM EDT by a video enabled telemedicine application and verified that I am speaking with the correct person using two identifiers. Location patient: home Location provider: work or home office Persons participating in the virtual visit: patient, provider  I discussed the limitations, risks, security and privacy concerns of performing an evaluation and management service by telephone and the availability of in person appointments. I also discussed with the patient that there may be a patient responsible charge related to this service. The patient expressed understanding and agreed to proceed.  Reason for visit:  Follow up on rash and other chronic issues   HPI:  The patient has no signs or symptoms of COVID 19 infection (fever, cough, sore throat  or shortness of breath beyond what is typical for patient).  Patient denies contact with other persons with the above mentioned symptoms or with anyone confirmed to have COVID 8   53 yr old female with hypertension , prediabetes and obesity presents for evaluation of persistent pruritic rash that was first noticed in May with papules on one side of her chest wall.  The rash spread to the other side of her chest wall and then to her arms and legs .  She was seen by Dermatology [facility]) twice and treated for eczema .  The second and most recent course of therapy was clobetasol steroid cream and oral hydroxyzine , which she states has not worked.   2) Hypertension: patient does not check blood pressure.   Patient is  following a reduced salt diet most days and is taking medications as prescribed.  3) snoring:  She was referred for a sleep study in October, but she deferred the test.    ROS: See pertinent positives and negatives per HPI.  Past Medical History:  Diagnosis Date  . Bradycardia   . Hyperlipidemia   . Hypertension   . Obesity (BMI 30-39.9)     Past Surgical History:  Procedure Laterality Date  . ABDOMINAL HYSTERECTOMY    . TONSILLECTOMY  1979    Family History  Problem Relation Age of Onset  . Diabetes Mother     SOCIAL HX:  reports that she quit smoking about 26 years ago. Her smoking use included cigarettes. She smoked 0.50 packs per day. She has never used smokeless tobacco. She reports that she does not drink alcohol or use drugs.  Current Outpatient Medications:  .  aspirin 81 MG tablet, Take 81 mg by mouth daily.  , Disp: , Rfl:  .  hydrOXYzine (ATARAX/VISTARIL) 10 MG tablet, , Disp: , Rfl:  .  telmisartan-hydrochlorothiazide (MICARDIS HCT) 80-25 MG tablet, Take 1 tablet by mouth daily., Disp: 90 tablet, Rfl: 1 .  amLODipine (NORVASC) 5 MG tablet, Take 1 tablet (5 mg total) by mouth daily. (Patient not taking: Reported on 03/26/2019), Disp: 90 tablet, Rfl: 3 .  clobetasol ointment (TEMOVATE) 0.05 %, , Disp: , Rfl:  .  ergocalciferol (VITAMIN D2) 50000 units capsule, Take 1 capsule (50,000 Units total) by mouth once a week. (Patient not taking: Reported on 07/12/2018), Disp: 12  capsule, Rfl: 0 .  HYDROcodone-acetaminophen (NORCO) 10-325 MG tablet, Take 1 tablet by mouth every 6 (six) hours as needed. (Patient not taking: Reported on 03/26/2019), Disp: 30 tablet, Rfl: 0 .  levofloxacin (LEVAQUIN) 500 MG tablet, Take 1 tablet (500 mg total) by mouth daily. (Patient not taking: Reported on 03/26/2019), Disp: 7 tablet, Rfl: 0 .  Phentermine HCl 15 MG TBDP, Take 1 tablet by mouth daily before breakfast., Disp: 30 tablet, Rfl: 0 .  potassium chloride SA (KLOR-CON M20) 20 MEQ tablet,  Take 1 tablet (20 mEq total) by mouth daily. (Patient not taking: Reported on 03/26/2019), Disp: 30 tablet, Rfl: 5 .  predniSONE (DELTASONE) 10 MG tablet, 6 tablets on Day 1 , then reduce by 1 tablet daily until gone (Patient not taking: Reported on 03/26/2019), Disp: 21 tablet, Rfl: 0 .  tretinoin (RETIN-A) 0.025 % cream, APPLY THIN LAYER TO FACE QHS. WASH OFF QAM, Disp: , Rfl:  .  triamcinolone ointment (KENALOG) 0.1 %, , Disp: , Rfl:  .  valACYclovir (VALTREX) 500 MG tablet, , Disp: , Rfl:   EXAM:  VITALS per patient if applicable:  GENERAL: alert, oriented, appears well and in no acute distress  HEENT: atraumatic, conjunttiva clear, no obvious abnormalities on inspection of external nose and ears  NECK: normal movements of the head and neck  LUNGS: on inspection no signs of respiratory distress, breathing rate appears normal, no obvious gross SOB, gasping or wheezing  CV: no obvious cyanosis  MS: moves all visible extremities without noticeable abnormality  PSYCH/NEURO: pleasant and cooperative, no obvious depression or anxiety, speech and thought processing grossly intact  ASSESSMENT AND PLAN:  Discussed the following assessment and plan:   Hypertension RN visit needed n 1-2 Weeks for bp check  AND LABS   Eczema She continues to report itching and rash despite two visits to dermatology .  She is currently using clobetasol and hydroxyzine . Records requested  Prediabetes Low GI diet recommended and printed handouts given.  Repeat labs in 3 months     I discussed the assessment and treatment plan with the patient. The patient was provided an opportunity to ask questions and all were answered. The patient agreed with the plan and demonstrated an understanding of the instructions.   The patient was advised to call back or seek an in-person evaluation if the symptoms worsen or if the condition fails to improve as anticipated.  I provided 25 minutes of non-face-to-face time  during this encounter.   Amy House , MD

## 2019-03-27 ENCOUNTER — Telehealth: Payer: Self-pay | Admitting: Internal Medicine

## 2019-03-27 DIAGNOSIS — L309 Dermatitis, unspecified: Secondary | ICD-10-CM | POA: Insufficient documentation

## 2019-03-27 NOTE — Telephone Encounter (Signed)
Pt dropped off a ABSS/covid form. Form is up front in [employer]'s color folder. Please fax when ready.

## 2019-03-27 NOTE — Assessment & Plan Note (Signed)
Low GI diet recommended and printed handouts given.  Repeat labs in 3 months

## 2019-03-27 NOTE — Assessment & Plan Note (Signed)
She continues to report itching and rash despite two visits to dermatology .  She is currently using clobetasol and hydroxyzine . Records requested

## 2019-03-28 NOTE — Telephone Encounter (Signed)
Placed in red folder  

## 2019-04-03 ENCOUNTER — Other Ambulatory Visit: Payer: BC Managed Care – PPO

## 2019-04-07 NOTE — Telephone Encounter (Signed)
Pt called to investigate status. Please advise, in need of capsules for Phentermine HCl 15 MG TBDP

## 2019-04-09 MED ORDER — PHENTERMINE HCL 15 MG PO CAPS: 15.0000 mg | ORAL_CAPSULE | Freq: Two times a day (BID) | ORAL | 2 refills | Status: DC

## 2019-04-17 ENCOUNTER — Other Ambulatory Visit: Payer: Self-pay

## 2019-04-17 ENCOUNTER — Other Ambulatory Visit (INDEPENDENT_AMBULATORY_CARE_PROVIDER_SITE_OTHER): Payer: BC Managed Care – PPO

## 2019-04-17 ENCOUNTER — Ambulatory Visit (INDEPENDENT_AMBULATORY_CARE_PROVIDER_SITE_OTHER): Payer: BC Managed Care – PPO

## 2019-04-17 ENCOUNTER — Telehealth: Payer: Self-pay | Admitting: Internal Medicine

## 2019-04-17 VITALS — BP 150/92 | HR 80

## 2019-04-17 DIAGNOSIS — R21 Rash and other nonspecific skin eruption: Secondary | ICD-10-CM | POA: Diagnosis not present

## 2019-04-17 DIAGNOSIS — E7800 Pure hypercholesterolemia, unspecified: Secondary | ICD-10-CM | POA: Diagnosis not present

## 2019-04-17 DIAGNOSIS — I10 Essential (primary) hypertension: Secondary | ICD-10-CM

## 2019-04-17 DIAGNOSIS — R7303 Prediabetes: Secondary | ICD-10-CM | POA: Diagnosis not present

## 2019-04-17 LAB — CBC WITH DIFFERENTIAL/PLATELET
Basophils Absolute: 0.1 10*3/uL (ref 0.0–0.1)
Basophils Relative: 0.7 % (ref 0.0–3.0)
Eosinophils Absolute: 0.3 10*3/uL (ref 0.0–0.7)
Eosinophils Relative: 3.5 % (ref 0.0–5.0)
HCT: 36.6 % (ref 36.0–46.0)
Hemoglobin: 11.9 g/dL — ABNORMAL LOW (ref 12.0–15.0)
Lymphocytes Relative: 26.2 % (ref 12.0–46.0)
Lymphs Abs: 2.1 10*3/uL (ref 0.7–4.0)
MCHC: 32.5 g/dL (ref 30.0–36.0)
MCV: 82.4 fl (ref 78.0–100.0)
Monocytes Absolute: 0.5 10*3/uL (ref 0.1–1.0)
Monocytes Relative: 5.7 % (ref 3.0–12.0)
Neutro Abs: 5.1 10*3/uL (ref 1.4–7.7)
Neutrophils Relative %: 63.9 % (ref 43.0–77.0)
Platelets: 215 10*3/uL (ref 150.0–400.0)
RBC: 4.45 Mil/uL (ref 3.87–5.11)
RDW: 16.3 % — ABNORMAL HIGH (ref 11.5–15.5)
WBC: 8 10*3/uL (ref 4.0–10.5)

## 2019-04-17 LAB — COMPREHENSIVE METABOLIC PANEL
ALT: 18 U/L (ref 0–35)
AST: 17 U/L (ref 0–37)
Albumin: 4.1 g/dL (ref 3.5–5.2)
Alkaline Phosphatase: 94 U/L (ref 39–117)
BUN: 20 mg/dL (ref 6–23)
CO2: 29 mEq/L (ref 19–32)
Calcium: 9.3 mg/dL (ref 8.4–10.5)
Chloride: 100 mEq/L (ref 96–112)
Creatinine, Ser: 0.9 mg/dL (ref 0.40–1.20)
GFR: 78.54 mL/min (ref >60.00)
Glucose, Bld: 121 mg/dL — ABNORMAL HIGH (ref 70–99)
Potassium: 3.3 mEq/L — ABNORMAL LOW (ref 3.5–5.1)
Sodium: 138 mEq/L (ref 135–145)
Total Bilirubin: 0.6 mg/dL (ref 0.2–1.2)
Total Protein: 6.9 g/dL (ref 6.0–8.3)

## 2019-04-17 LAB — MICROALBUMIN / CREATININE URINE RATIO
Creatinine,U: 102.5 mg/dL
Microalb Creat Ratio: 1.7 mg/g (ref 0.0–30.0)
Microalb, Ur: 1.8 mg/dL (ref 0.0–1.9)

## 2019-04-17 LAB — HEMOGLOBIN A1C: Hgb A1c MFr Bld: 6 % (ref 4.6–6.5)

## 2019-04-17 LAB — LIPID PANEL
Cholesterol: 179 mg/dL (ref 0–200)
HDL: 38.6 mg/dL — ABNORMAL LOW (ref >39.00)
LDL Cholesterol: 119 mg/dL — ABNORMAL HIGH (ref 0–99)
NonHDL: 140.42
Total CHOL/HDL Ratio: 5
Triglycerides: 105 mg/dL (ref 0.0–149.0)
VLDL: 21 mg/dL (ref 0.0–40.0)

## 2019-04-17 LAB — TSH: TSH: 1.18 u[IU]/mL (ref 0.35–4.50)

## 2019-04-17 NOTE — Progress Notes (Addendum)
Patient here for nurse visit BP check per MD order from 03/26/19.   Patient reports compliance with prescribed BP medications: YES  Last dose of BP medication: 7:30 am today  Pt's bp was checked in right arm 168/98 and rechecked 10 minutes later in left arm 150/92.  BP checked by Juliann Pulse, RN team lead.  BP Readings from Last 3 Encounters:  04/17/19 (!) 150/92  07/12/18 136/82  06/21/18 (!) 156/102   Pulse Readings from Last 3 Encounters:  04/17/19 80  07/12/18 87  06/21/18 68    Patient denies having any symptoms today.  No chest pain, no chest tightness, no lightheadedness, no dizziness.  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     Dorise Bullion, CMA    I have reviewed the above information  From Nurse visit  Patient taking amlodipine 5 mg and telmisartan/hct 80/12.5  .  Advise to increase amlodipine to 10 mg daily and repeat BP check in 2 weeks with RN   Deborra Medina, MD

## 2019-04-17 NOTE — Telephone Encounter (Signed)
Nurse visit scheduled and patient to bring new home cuff.

## 2019-04-17 NOTE — Telephone Encounter (Signed)
[place] with BP check  On this patient this morning , patient was very anxious and concerned about school restarting and being in class with possible exposure risk to COVID. Patient kept over this the whole while trying to check pressures, I actually attained reported pressures. Patient called back to office after visit still concerning for being anxious. Plus patient wanted to make PCP aware she has not yet received the phentermine due to  Pharmacy  Says patient needs the capsule form of this medication that the tablet is no longer manufactured.   Nurse also advised patient o bring home cuff the next time she is in office for BP to check accuracy because patient stated home readings have been normal.

## 2019-04-17 NOTE — Telephone Encounter (Signed)
The phentermine capsule form was sent in last week. But she should not  start it until we are sure her bp is well controlled  So she will need another rn [place]

## 2019-04-21 ENCOUNTER — Ambulatory Visit: Payer: BC Managed Care – PPO

## 2019-04-23 ENCOUNTER — Telehealth: Payer: Self-pay | Admitting: Internal Medicine

## 2019-04-23 NOTE — Telephone Encounter (Signed)
  I have reviewed the above information  From recent Nurse visit for BP check  Confirm that   Patient taking amlodipine 5 mg and telmisartan/hct 80/12.5  .  If so,  Advise to increase amlodipine to 10 mg daily , continue telmisartan /hct  and repeat BP check in 2 weeks with RN   Deborra Medina, MD

## 2019-04-23 NOTE — Telephone Encounter (Signed)
LMTCB

## 2019-04-23 NOTE — Telephone Encounter (Signed)
Patient returning call. Per FC, unavailable to take call.

## 2019-04-24 NOTE — Telephone Encounter (Signed)
LMTCB. PEC may speak with pt.  

## 2019-04-28 MED ORDER — POTASSIUM CHLORIDE CRYS ER 20 MEQ PO TBCR: 20.0000 meq | EXTENDED_RELEASE_TABLET | Freq: Every day | ORAL | 1 refills | Status: DC

## 2019-04-29 NOTE — Telephone Encounter (Signed)
Ok.  When she calls back if her readings are MOSTLY > 140/90,  SHE SHOULD increase the amlodipine as previously directed,  If  It's a close call,  Send to me for interpretation

## 2019-04-29 NOTE — Telephone Encounter (Signed)
Called pt this morning and she stated that she is taking the Amlodipine 5mg  daily and the Telmisartan/HCTZ 80/12.5mg  daily. I advised the pt that she should increase the Amlodipine to 10mg  daily and continue the telmisartan/hctz. Pt stated that she has a bp machine at home and she has been checking it and she stated that her blood pressure has improved. 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

## 2019-05-22 ENCOUNTER — Encounter: Payer: Self-pay | Admitting: Internal Medicine

## 2019-07-08 ENCOUNTER — Telehealth: Payer: Self-pay

## 2019-07-08 MED ORDER — AMLODIPINE BESYLATE 5 MG PO TABS: 5.0000 mg | ORAL_TABLET | Freq: Every day | ORAL | 3 refills | Status: DC

## 2019-07-08 NOTE — Telephone Encounter (Signed)
LMTCB. Need to find out from pt if she is taking Amlodipine because her medication list states that she is not taking it but we received a refill request for it.

## 2019-07-08 NOTE — Telephone Encounter (Signed)
Patient returning call to [place]. States that she is still taking Amlodipine.  [facility] [place], [place]

## 2019-07-08 NOTE — Telephone Encounter (Signed)
Medication has been refilled.

## 2019-07-08 NOTE — Addendum Note (Signed)
Addended by: Adair Laundry on: 07/08/2019 02:44 PM   Modules accepted: Orders

## 2019-07-16 DIAGNOSIS — B009 Herpesviral infection, unspecified: Secondary | ICD-10-CM

## 2019-07-17 ENCOUNTER — Telehealth: Payer: Self-pay

## 2019-07-17 NOTE — Telephone Encounter (Signed)
Sch appt first

## 2019-07-17 NOTE — Telephone Encounter (Signed)
Pt called triage needing a refill on her Valtrex, seems like she has not been seen at [facility] in [place], please advise if you can refill

## 2019-07-18 ENCOUNTER — Other Ambulatory Visit: Payer: Self-pay | Admitting: Obstetrics & Gynecology

## 2019-07-18 DIAGNOSIS — B009 Herpesviral infection, unspecified: Secondary | ICD-10-CM

## 2019-07-18 MED ORDER — VALACYCLOVIR HCL 500 MG PO TABS: 500.0000 mg | ORAL_TABLET | Freq: Every day | ORAL | 1 refills | Status: DC

## 2019-07-18 NOTE — Telephone Encounter (Signed)
Called and left voice mail for patient to call back to be schedule °

## 2019-07-18 NOTE — Telephone Encounter (Signed)
Patient is schedule for first available appointment 08/25/19 with [place]. Patient is requesting refill to get to her schedule appointment. [facility]

## 2019-08-05 ENCOUNTER — Telehealth: Payer: Self-pay | Admitting: Internal Medicine

## 2019-08-05 DIAGNOSIS — L270 Generalized skin eruption due to drugs and medicaments taken internally: Secondary | ICD-10-CM

## 2019-08-05 NOTE — Telephone Encounter (Signed)
Pt called in to update provider, pt says that she had a biopsy with dermatology and determined that pt is having a reaction to the amLODipine (NORVASC) 5 MG tablet. Pt says that she was told by Dr. Augustin Coupe that he was going to update PCP and request a new medication for pt.    CB: [phone]   Pharmacy:   [facility] [place], [place] [facility] [phone] (Phone) [phone] (Fax)

## 2019-08-05 NOTE — Telephone Encounter (Signed)
I haven't received any correspondence from Dr Augustin Coupe, please request it [facility] dermatology)   She will need to follow up with me on blood pressure management if she stops the amlodipine .  Is she still taking phentermine ?

## 2019-08-06 NOTE — Telephone Encounter (Signed)
Medical record release has been faxed to [facility].

## 2019-08-25 ENCOUNTER — Ambulatory Visit: Payer: BC Managed Care – PPO | Admitting: Obstetrics & Gynecology

## 2019-08-25 DIAGNOSIS — L270 Generalized skin eruption due to drugs and medicaments taken internally: Secondary | ICD-10-CM | POA: Insufficient documentation

## 2019-08-25 NOTE — Telephone Encounter (Signed)
I received the report from Dr Laurence Ferrari about your rash.  ; the suspected culprit is  either amlodipine or hctz. Which ever one you started more recently we need to stop for a period of 4 months.  I believe it was the amlodipine    .  Please get your blood pressure checked 4 or 5 days after stopping it and let me know If we need to replace it with something else.   Regards,   Deborra Medina, MD

## 2019-08-25 NOTE — Assessment & Plan Note (Signed)
Per dermatology with biopsy ; the suspected culprit is  either amlodipine or hctz.  Need to stop one for 4 months

## 2019-08-26 NOTE — Telephone Encounter (Signed)
Spoke with pt to let her know she needs to stop taking the amlodipine for a least 4 months to see if this helps with the rash. The pt stated that she has already stopped the amlodipine. I let the pt know that she needs to check her blood pressure 4 to 5 days after stopping the medication and send [place] the reading to see if something else needs to be called in to manage her blood pressure. Pt stated that she would check it and either call us or mychart [place] the reading.

## 2019-10-06 ENCOUNTER — Telehealth: Payer: Self-pay | Admitting: Obstetrics & Gynecology

## 2019-10-06 ENCOUNTER — Ambulatory Visit (INDEPENDENT_AMBULATORY_CARE_PROVIDER_SITE_OTHER): Payer: BC Managed Care – PPO | Admitting: Obstetrics & Gynecology

## 2019-10-06 ENCOUNTER — Other Ambulatory Visit (HOSPITAL_COMMUNITY)
Admission: RE | Admit: 2019-10-06 | Discharge: 2019-10-06 | Disposition: A | Discharge status: Home / Self Care | Payer: BC Managed Care – PPO | Source: Ambulatory Visit | Attending: Obstetrics & Gynecology | Admitting: Obstetrics & Gynecology

## 2019-10-06 ENCOUNTER — Other Ambulatory Visit: Payer: Self-pay

## 2019-10-06 ENCOUNTER — Encounter: Payer: Self-pay | Admitting: Obstetrics & Gynecology

## 2019-10-06 VITALS — BP 122/80 | Ht 65.0 in

## 2019-10-06 DIAGNOSIS — Z1272 Encounter for screening for malignant neoplasm of vagina: Secondary | ICD-10-CM | POA: Insufficient documentation

## 2019-10-06 DIAGNOSIS — Z01419 Encounter for gynecological examination (general) (routine) without abnormal findings: Secondary | ICD-10-CM

## 2019-10-06 DIAGNOSIS — Z1211 Encounter for screening for malignant neoplasm of colon: Secondary | ICD-10-CM

## 2019-10-06 DIAGNOSIS — Z1231 Encounter for screening mammogram for malignant neoplasm of breast: Secondary | ICD-10-CM

## 2019-10-06 DIAGNOSIS — B009 Herpesviral infection, unspecified: Secondary | ICD-10-CM

## 2019-10-06 MED ORDER — VALACYCLOVIR HCL 500 MG PO TABS: 500.0000 mg | ORAL_TABLET | Freq: Every day | ORAL | 1 refills | Status: AC

## 2019-10-06 NOTE — Telephone Encounter (Signed)
Patient is aware to contact [facility] for Mammogram

## 2019-10-06 NOTE — Telephone Encounter (Signed)
-----   Message from Nadara Mustard, MD sent at 10/06/2019 11:09 AM EST ----- Regarding: MMG at [facility] in March

## 2019-10-06 NOTE — Patient Instructions (Addendum)
PAP every 5 years Mammogram every year    At [facility] Colonoscopy every 10 years    Stool cards yearly Labs yearly (with PCP)

## 2019-10-06 NOTE — Progress Notes (Signed)
HPI:      [name] is a 54 y.o. G1P1001 who LMP was in the past, she presents today for her annual examination.  The patient has no complaints today. The patient is sexually active. Herlast pap: approximate date 2015 and was normal and last mammogram: approximate date 2020 and was normal.  The patient does perform self breast exams.  There is no notable family history of breast or ovarian cancer in her family. The patient is not taking hormone replacement therapy. Patient denies post-menopausal vaginal bleeding.   The patient has regular exercise: yes. The patient denies current symptoms of depression.    GYN Hx: Last Colonoscopy:5 years ago. Normal.   PMHx: Past Medical History:  Diagnosis Date  . Bradycardia   . Hyperlipidemia   . Hypertension   . Obesity (BMI 30-39.9)    Past Surgical History:  Procedure Laterality Date  . ABDOMINAL HYSTERECTOMY    . SKIN BIOPSY    . TONSILLECTOMY  1979   Family History  Problem Relation Age of Onset  . Diabetes Mother    Social History   Tobacco Use  . Smoking status: Former Smoker    Packs/day: 0.50    Types: Cigarettes    Quit date: 04/01/1993    Years since quitting: 26.5  . Smokeless tobacco: Never Used  Substance Use Topics  . Alcohol use: No  . Drug use: No    Current Outpatient Medications:  .  aspirin 81 MG tablet, Take 81 mg by mouth daily.  , Disp: , Rfl:  .  potassium chloride SA (KLOR-CON M20) 20 MEQ tablet, Take 1 tablet (20 mEq total) by mouth daily., Disp: 90 tablet, Rfl: 1 .  telmisartan-hydrochlorothiazide (MICARDIS HCT) 80-25 MG tablet, Take 1 tablet by mouth daily., Disp: 90 tablet, Rfl: 1 .  amLODipine (NORVASC) 5 MG tablet, Take 1 tablet (5 mg total) by mouth daily. (Patient not taking: Reported on 10/06/2019), Disp: 90 tablet, Rfl: 3 .  clobetasol ointment (TEMOVATE) 0.05 %, , Disp: , Rfl:  .  ergocalciferol (VITAMIN D2) 50000 units capsule, Take 1 capsule (50,000 Units total) by mouth once a week.  (Patient not taking: Reported on 07/12/2018), Disp: 12 capsule, Rfl: 0 .  hydrOXYzine (ATARAX/VISTARIL) 10 MG tablet, , Disp: , Rfl:  .  levofloxacin (LEVAQUIN) 500 MG tablet, Take 1 tablet (500 mg total) by mouth daily. (Patient not taking: Reported on 03/26/2019), Disp: 7 tablet, Rfl: 0 .  predniSONE (DELTASONE) 10 MG tablet, 6 tablets on Day 1 , then reduce by 1 tablet daily until gone (Patient not taking: Reported on 03/26/2019), Disp: 21 tablet, Rfl: 0 .  tretinoin (RETIN-A) 0.025 % cream, APPLY THIN LAYER TO FACE QHS. WASH OFF QAM, Disp: , Rfl:  .  triamcinolone ointment (KENALOG) 0.1 %, , Disp: , Rfl:  .  valACYclovir (VALTREX) 500 MG tablet, Take 1 tablet (500 mg total) by mouth daily., Disp: 30 tablet, Rfl: 1 Allergies: Penicillins  Review of Systems  Constitutional: Negative for chills, fever and malaise/fatigue.  HENT: Negative for congestion, sinus pain and sore throat.   Eyes: Negative for blurred vision and pain.  Respiratory: Negative for cough and wheezing.   Cardiovascular: Negative for chest pain and leg swelling.  Gastrointestinal: Negative for abdominal pain, constipation, diarrhea, heartburn, nausea and vomiting.  Genitourinary: Negative for dysuria, frequency, hematuria and urgency.  Musculoskeletal: Negative for back pain, joint pain, myalgias and neck pain.  Skin: Positive for rash. Negative for itching.  Neurological: Negative for  dizziness, tremors and weakness.  Endo/Heme/Allergies: Does not bruise/bleed easily.  Psychiatric/Behavioral: Negative for depression. The patient is not nervous/anxious and does not have insomnia.     Objective: BP 122/80   Ht 5\' 5"  (1.651 m)   BMI 48.82 kg/m  There were no vitals filed for this visit. Body mass index is 48.82 kg/m. Physical Exam Constitutional:      General: She is not in acute distress.    Appearance: She is well-developed. She is obese.  Genitourinary:     Pelvic exam was performed with patient supine.      Vagina and rectum normal.     No lesions in the vagina.     No vaginal bleeding.     No right or left adnexal mass present.     Right adnexa not tender.     Left adnexa not tender.     Genitourinary Comments: Absent Uterus Absent cervix Vaginal cuff well healed  HENT:     Head: Normocephalic and atraumatic. No laceration.     Right Ear: Hearing normal.     Left Ear: Hearing normal.     Mouth/Throat:     Pharynx: Uvula midline.  Eyes:     Pupils: Pupils are equal, round, and reactive to light.  Neck:     Thyroid: No thyromegaly.  Cardiovascular:     Rate and Rhythm: Normal rate and regular rhythm.     Heart sounds: No murmur. No friction rub. No gallop.   Pulmonary:     Effort: Pulmonary effort is normal. No respiratory distress.     Breath sounds: Normal breath sounds. No wheezing.  Chest:     Breasts:        Right: No mass, skin change or tenderness.        Left: No mass, skin change or tenderness.  Abdominal:     General: Bowel sounds are normal. There is no distension.     Palpations: Abdomen is soft.     Tenderness: There is no abdominal tenderness. There is no rebound.  Musculoskeletal:        General: Normal range of motion.     Cervical back: Normal range of motion and neck supple.  Neurological:     Mental Status: She is alert and oriented to person, place, and time.     Cranial Nerves: No cranial nerve deficit.  Skin:    General: Skin is warm and dry.  Psychiatric:        Judgment: Judgment normal.  Vitals reviewed.     Assessment: Annual Exam 1. Women's annual routine gynecological examination   2. Encounter for screening mammogram for malignant neoplasm of breast   3. Morbid obesity (HCC) Chronic  4. Herpes simplex   5. Screening for vaginal cancer   6. Screen for colon cancer     Plan:            1.  Cervical Screening-  Pap smear done today  2. Breast screening- Exam annually and mammogram scheduled  3. Colonoscopy every 10 years, Hemoccult  testing after age 35  4. Labs managed by PCP  5. Counseling for hormonal therapy: none              6. FRAX - FRAX score for assessing the 10 year probability for fracture calculated and discussed today.  Based on age and score today, DEXA is not currently scheduled.   7. Valtrex for when she has x's of HSV Also can take preventative when  as triggers (stress)    F/U  Return in about 1 year (around 10/05/2020) for Annual.  Barnett Applebaum, MD, [facility], [facility] 10/06/2019  11:07 AM

## 2019-10-08 LAB — CYTOLOGY - PAP
Adequacy: ABSENT
Diagnosis: NEGATIVE
Diagnosis: REACTIVE

## 2019-11-02 DIAGNOSIS — E876 Hypokalemia: Secondary | ICD-10-CM

## 2019-12-11 ENCOUNTER — Ambulatory Visit: Payer: BC Managed Care – PPO | Admitting: Dermatology

## 2020-01-06 MED ORDER — TELMISARTAN-HCTZ 80-25 MG PO TABS: 1.0000 | ORAL_TABLET | Freq: Every day | ORAL | 1 refills | Status: DC

## 2020-01-30 ENCOUNTER — Telehealth: Payer: Self-pay

## 2020-01-30 NOTE — Telephone Encounter (Signed)
-----   Message from Nadara Mustard, MD sent at 01/29/2020  3:41 PM EDT ----- Regarding: MMG Received notice she has not received MMG yet as ordered at her Annual. Please check and encourage her to do this, and document conversation.

## 2020-01-30 NOTE — Telephone Encounter (Signed)
Left message for pt to schedule her mammogram

## 2020-03-04 ENCOUNTER — Telehealth: Payer: Self-pay | Admitting: Internal Medicine

## 2020-03-04 DIAGNOSIS — I10 Essential (primary) hypertension: Secondary | ICD-10-CM

## 2020-03-04 DIAGNOSIS — E7800 Pure hypercholesterolemia, unspecified: Secondary | ICD-10-CM

## 2020-03-04 DIAGNOSIS — R7303 Prediabetes: Secondary | ICD-10-CM

## 2020-03-04 DIAGNOSIS — Z0000 Encounter for general adult medical examination without abnormal findings: Secondary | ICD-10-CM

## 2020-03-04 NOTE — Telephone Encounter (Signed)
Pt scheduled cpe for 04/19/20 and she wants to know if Dr. Darrick Huntsman wants labs before her appt?

## 2020-03-05 NOTE — Telephone Encounter (Signed)
Pt would like to have labs done prior to her physical in august. I have ordered TSH, CBC, CMP, A1c, lipid panel  And microalbumin. Is there anything else that needs to be ordered?

## 2020-03-08 ENCOUNTER — Other Ambulatory Visit: Payer: Self-pay | Admitting: Obstetrics & Gynecology

## 2020-03-17 ENCOUNTER — Non-Acute Institutional Stay: Payer: BC Managed Care – PPO | Admitting: Nurse Practitioner

## 2020-03-17 ENCOUNTER — Other Ambulatory Visit: Payer: Self-pay

## 2020-04-19 ENCOUNTER — Encounter: Payer: BC Managed Care – PPO | Admitting: Internal Medicine

## 2020-04-23 ENCOUNTER — Ambulatory Visit (INDEPENDENT_AMBULATORY_CARE_PROVIDER_SITE_OTHER): Payer: BC Managed Care – PPO | Admitting: Internal Medicine

## 2020-04-23 ENCOUNTER — Other Ambulatory Visit: Payer: Self-pay

## 2020-04-23 ENCOUNTER — Encounter: Payer: Self-pay | Admitting: Internal Medicine

## 2020-04-23 VITALS — BP 168/100 | HR 87 | Temp 97.3°F | Ht 65.0 in | Wt 292.2 lb

## 2020-04-23 DIAGNOSIS — Z114 Encounter for screening for human immunodeficiency virus [HIV]: Secondary | ICD-10-CM | POA: Diagnosis not present

## 2020-04-23 DIAGNOSIS — E7800 Pure hypercholesterolemia, unspecified: Secondary | ICD-10-CM

## 2020-04-23 DIAGNOSIS — E876 Hypokalemia: Secondary | ICD-10-CM

## 2020-04-23 DIAGNOSIS — Z0000 Encounter for general adult medical examination without abnormal findings: Secondary | ICD-10-CM | POA: Diagnosis not present

## 2020-04-23 DIAGNOSIS — I10 Essential (primary) hypertension: Secondary | ICD-10-CM

## 2020-04-23 DIAGNOSIS — L2084 Intrinsic (allergic) eczema: Secondary | ICD-10-CM

## 2020-04-23 DIAGNOSIS — R7303 Prediabetes: Secondary | ICD-10-CM | POA: Diagnosis not present

## 2020-04-23 DIAGNOSIS — E261 Secondary hyperaldosteronism: Secondary | ICD-10-CM

## 2020-04-23 DIAGNOSIS — Z6841 Body Mass Index (BMI) 40.0 and over, adult: Secondary | ICD-10-CM

## 2020-04-23 LAB — COMPREHENSIVE METABOLIC PANEL
ALT: 14 U/L (ref 0–35)
AST: 18 U/L (ref 0–37)
Albumin: 4.1 g/dL (ref 3.5–5.2)
Alkaline Phosphatase: 102 U/L (ref 39–117)
BUN: 21 mg/dL (ref 6–23)
CO2: 28 mEq/L (ref 19–32)
Calcium: 9.4 mg/dL (ref 8.4–10.5)
Chloride: 99 mEq/L (ref 96–112)
Creatinine, Ser: 0.85 mg/dL (ref 0.40–1.20)
GFR: 83.59 mL/min (ref >60.00)
Glucose, Bld: 121 mg/dL — ABNORMAL HIGH (ref 70–99)
Potassium: 3.3 mEq/L — ABNORMAL LOW (ref 3.5–5.1)
Sodium: 138 mEq/L (ref 135–145)
Total Bilirubin: 0.7 mg/dL (ref 0.2–1.2)
Total Protein: 7.1 g/dL (ref 6.0–8.3)

## 2020-04-23 LAB — LIPID PANEL
Cholesterol: 173 mg/dL (ref 0–200)
HDL: 38.1 mg/dL — ABNORMAL LOW (ref >39.00)
LDL Cholesterol: 114 mg/dL — ABNORMAL HIGH (ref 0–99)
NonHDL: 134.44
Total CHOL/HDL Ratio: 5
Triglycerides: 100 mg/dL (ref 0.0–149.0)
VLDL: 20 mg/dL (ref 0.0–40.0)

## 2020-04-23 LAB — CBC WITH DIFFERENTIAL/PLATELET
Basophils Absolute: 0 10*3/uL (ref 0.0–0.1)
Basophils Relative: 0.5 % (ref 0.0–3.0)
Eosinophils Absolute: 0.2 10*3/uL (ref 0.0–0.7)
Eosinophils Relative: 2.7 % (ref 0.0–5.0)
HCT: 37.1 % (ref 36.0–46.0)
Hemoglobin: 12.3 g/dL (ref 12.0–15.0)
Lymphocytes Relative: 20.5 % (ref 12.0–46.0)
Lymphs Abs: 1.7 10*3/uL (ref 0.7–4.0)
MCHC: 33.1 g/dL (ref 30.0–36.0)
MCV: 80 fl (ref 78.0–100.0)
Monocytes Absolute: 0.4 10*3/uL (ref 0.1–1.0)
Monocytes Relative: 4.9 % (ref 3.0–12.0)
Neutro Abs: 5.8 10*3/uL (ref 1.4–7.7)
Neutrophils Relative %: 71.4 % (ref 43.0–77.0)
Platelets: 224 10*3/uL (ref 150.0–400.0)
RBC: 4.64 Mil/uL (ref 3.87–5.11)
RDW: 16.1 % — ABNORMAL HIGH (ref 11.5–15.5)
WBC: 8.1 10*3/uL (ref 4.0–10.5)

## 2020-04-23 LAB — HEMOGLOBIN A1C: Hgb A1c MFr Bld: 6 % (ref 4.6–6.5)

## 2020-04-23 LAB — TSH: TSH: 1.43 u[IU]/mL (ref 0.35–4.50)

## 2020-04-23 NOTE — Progress Notes (Signed)
Patient ID: Amy House, female    DOB: 1966/03/19  Age: 54 y.o. MRN: [id number]  The patient is here for annual preventive  examination and management of other chronic and acute problems.   The risk factors are reflected in the social history.  The roster of all physicians providing medical care to patient - is listed in the Snapshot section of the chart.  Activities of daily living:  The patient is 100% independent in all ADLs: dressing, toileting, feeding as well as independent mobility  Home safety : The patient has smoke detectors in the home. They wear seatbelts.  There are no firearms at home. There is no violence in the home.   There is no risks for hepatitis, STDs or HIV. There is no   history of blood transfusion. They have no travel history to infectious disease endemic areas of the world.  The patient has seen their dentist in the last six month. They have seen their eye doctor in the last year. They admit to slight hearing difficulty with regard to whispered voices and some television programs.  They have deferred audiologic testing in the last year.  They do not  have excessive sun exposure. Discussed the need for sun protection: hats, long sleeves and use of sunscreen if there is significant sun exposure.   Diet: the importance of a healthy diet is discussed. They do have a healthy diet.  The benefits of regular aerobic exercise were discussed. She walks 4 times per week ,  20 minutes.   Depression screen: there are no signs or vegative symptoms of depression- irritability, change in appetite, anhedonia, sadness/tearfullness.   The following portions of the patient's history were reviewed and updated as appropriate: allergies, current medications, past family history, past medical history,  past surgical history, past social history  and problem list.  Visual acuity was not assessed per patient preference since she has regular follow up with her ophthalmologist. Hearing  and body mass index were assessed and reviewed.   During the course of the visit the patient was educated and counseled about appropriate screening and preventive services including : fall prevention , diabetes screening, nutrition counseling, colorectal cancer screening, and recommended immunizations.    CC: The primary encounter diagnosis was Screening for HIV without presence of risk factors. Diagnoses of Essential hypertension, Encounter for preventive health examination, Pure hypercholesterolemia, Prediabetes, Morbid obesity with BMI of 45.0-49.9, adult (HCC), Intrinsic eczema, Secondary hyperaldosteronism (HCC), and Hypokalemia were also pertinent to this visit. 1) HTN: home readings are checked weekly and highest most recently was 130/71.    2) Eczema diagnosed with skin biopsy.  [facility] . She continues to have breakouts at least 6 months after the presumed offending drug, Amlodipine, was stopped .     History Zoriah has a past medical history of Acanthosis nigricans, Bradycardia, Dermatitis, Hyperlipidemia, Hypertension, and Obesity (BMI 30-39.9).   She has a past surgical history that includes Abdominal hysterectomy; Tonsillectomy (1979); and Skin biopsy.   Her family history includes Diabetes in her mother.She reports that she quit smoking about 27 years ago. Her smoking use included cigarettes. She smoked 0.50 packs per day. She has never used smokeless tobacco. She reports that she does not drink alcohol and does not use drugs.     Outpatient Medications Prior to Visit  Medication Sig Dispense Refill  . aspirin 81 MG tablet Take 81 mg by mouth daily.      . clobetasol ointment (TEMOVATE) 0.05 %     .  ergocalciferol (VITAMIN D2) 50000 units capsule Take 1 capsule (50,000 Units total) by mouth once a week. 12 capsule 0  . Halobetasol Propionate (LEXETTE) 0.05 % FOAM Apply AS directed TWICE DAILY TO AFFECTED AREA(S) UP TO THREE wks. AVOID AVOID FACE, GROIN, AND AXILLAE    .  hydrOXYzine (ATARAX/VISTARIL) 10 MG tablet     . hydrOXYzine (ATARAX/VISTARIL) 25 MG tablet Take 25-50 mg by mouth at bedtime as needed.    Marland Kitchen telmisartan-hydrochlorothiazide (MICARDIS HCT) 80-25 MG tablet Take 1 tablet by mouth daily. 90 tablet 1  . tretinoin (RETIN-A) 0.025 % cream APPLY THIN LAYER TO FACE QHS. WASH OFF QAM    . triamcinolone ointment (KENALOG) 0.1 %     . valACYclovir (VALTREX) 500 MG tablet Take 1 tablet (500 mg total) by mouth daily. 30 tablet 1  . predniSONE (DELTASONE) 10 MG tablet 6 tablets on Day 1 , then reduce by 1 tablet daily until gone 21 tablet 0  . amLODipine (NORVASC) 5 MG tablet Take 1 tablet (5 mg total) by mouth daily. (Patient not taking: Reported on 10/06/2019) 90 tablet 3  . potassium chloride SA (KLOR-CON M20) 20 MEQ tablet Take 1 tablet (20 mEq total) by mouth daily. (Patient not taking: Reported on 04/23/2020) 90 tablet 1   No facility-administered medications prior to visit.    Review of Systems  Objective:  BP (!) 168/100 (BP Location: Left Arm, Patient Position: Sitting)   Pulse 87   Temp (!) 97.3 F (36.3 C)   Ht 5\' 5"  (1.651 m)   Wt 292 lb 3.2 oz (132.5 kg)   SpO2 97%   BMI 48.62 kg/m   Physical Exam    Assessment & Plan:   Problem List Items Addressed This Visit      Unprioritized   Hyperlipidemia   Relevant Medications   spironolactone (ALDACTONE) 25 MG tablet   Eczema    By  Biopsy.  Using clobetasol pre dermatology      Encounter for preventive health examination    age appropriate education and counseling updated, referrals for preventative services and immunizations addressed, dietary and smoking counseling addressed, most recent labs reviewed.  I have personally reviewed and have noted:  1) the patient's medical and social history 2) The pt's use of alcohol, tobacco, and illicit drugs 3) The patient's current medications and supplements 4) Functional ability including ADL's, fall risk, home safety risk, hearing and  visual impairment 5) Diet and physical activities 6) Evidence for depression or mood disorder 7) The patient's height, weight, and BMI have been recorded in the chart  I have made referrals, and provided counseling and education based on review of the above      Hypertension    Now managed with telmisartan hctz. She has a history of   drug induced rash with amlodipine. Home readings 130/70 or less        Relevant Medications   spironolactone (ALDACTONE) 25 MG tablet   Morbid obesity with BMI of 45.0-49.9, adult (HCC)    I have addressed  BMI and recommended wt loss of 10% of body weigh over the next 6 months using a low glycemic index diet and regular exercise a minimum of 5 days per week.        Prediabetes    Low GI diet recommended and printed handouts given.  Repeat labs  Every 6 months       Secondary hyperaldosteronism (HCC)    Suggested by hypertension and hypokalemia.  Adding spironolactone  Other Visit Diagnoses    Screening for HIV without presence of risk factors    -  Primary   Relevant Orders   HIV Antibody (routine testing w rflx)   Hypokalemia       Relevant Orders   Basic metabolic panel      I have discontinued Mykenna Viele. Fleissner's predniSONE, potassium chloride SA, and amLODipine. I am also having her start on spironolactone. Additionally, I am having her maintain her aspirin, ergocalciferol, clobetasol ointment, hydrOXYzine, tretinoin, triamcinolone ointment, valACYclovir, Lexette, hydrOXYzine, and telmisartan-hydrochlorothiazide.  Meds ordered this encounter  Medications  . spironolactone (ALDACTONE) 25 MG tablet    Sig: Take 1 tablet (25 mg total) by mouth daily.    Dispense:  90 tablet    Refill:  1    Medications Discontinued During This Encounter  Medication Reason  . amLODipine (NORVASC) 5 MG tablet Completed Course  . potassium chloride SA (KLOR-CON M20) 20 MEQ tablet Completed Course  . predniSONE (DELTASONE) 10 MG tablet      Follow-up: Return in about 6 months (around 10/24/2020).   Sherlene Shams, MD

## 2020-04-23 NOTE — Patient Instructions (Signed)
Please return for a RN visit WITH your home blood pressure monitor   Health Maintenance for Postmenopausal Women Menopause is a normal process in which your ability to get pregnant comes to an end. This process happens slowly over many months or years, usually between the ages of 38 and 26. Menopause is complete when you have missed your menstrual periods for 12 months. It is important to talk with your health care provider about some of the most common conditions that affect women after menopause (postmenopausal women). These include heart disease, cancer, and bone loss (osteoporosis). Adopting a healthy lifestyle and getting preventive care can help to promote your health and wellness. The actions you take can also lower your chances of developing some of these common conditions. What should I know about menopause? During menopause, you may get a number of symptoms, such as:  Hot flashes. These can be moderate or severe.  Night sweats.  Decrease in sex drive.  Mood swings.  Headaches.  Tiredness.  Irritability.  Memory problems.  Insomnia. Choosing to treat or not to treat these symptoms is a decision that you make with your health care provider. Do I need hormone replacement therapy?  Hormone replacement therapy is effective in treating symptoms that are caused by menopause, such as hot flashes and night sweats.  Hormone replacement carries certain risks, especially as you become older. If you are thinking about using estrogen or estrogen with progestin, discuss the benefits and risks with your health care provider. What is my risk for heart disease and stroke? The risk of heart disease, heart attack, and stroke increases as you age. One of the causes may be a change in the body's hormones during menopause. This can affect how your body uses dietary fats, triglycerides, and cholesterol. Heart attack and stroke are medical emergencies. There are many things that you can do to help  prevent heart disease and stroke. Watch your blood pressure  High blood pressure causes heart disease and increases the risk of stroke. This is more likely to develop in people who have high blood pressure readings, are of African descent, or are overweight.  Have your blood pressure checked: ? Every 3-5 years if you are 60-21 years of age. ? Every year if you are 24 years old or older. Eat a healthy diet   Eat a diet that includes plenty of vegetables, fruits, low-fat dairy products, and lean protein.  Do not eat a lot of foods that are high in solid fats, added sugars, or sodium. Get regular exercise Get regular exercise. This is one of the most important things you can do for your health. Most adults should:  Try to exercise for at least 150 minutes each week. The exercise should increase your heart rate and make you sweat (moderate-intensity exercise).  Try to do strengthening exercises at least twice each week. Do these in addition to the moderate-intensity exercise.  Spend less time sitting. Even light physical activity can be beneficial. Other tips  Work with your health care provider to achieve or maintain a healthy weight.  Do not use any products that contain nicotine or tobacco, such as cigarettes, e-cigarettes, and chewing tobacco. If you need help quitting, ask your health care provider.  Know your numbers. Ask your health care provider to check your cholesterol and your blood sugar (glucose). Continue to have your blood tested as directed by your health care provider. Do I need screening for cancer? Depending on your health history and family history,  you may need to have cancer screening at different stages of your life. This may include screening for:  Breast cancer.  Cervical cancer.  Lung cancer.  Colorectal cancer. What is my risk for osteoporosis? After menopause, you may be at increased risk for osteoporosis. Osteoporosis is a condition in which bone  destruction happens more quickly than new bone creation. To help prevent osteoporosis or the bone fractures that can happen because of osteoporosis, you may take the following actions:  If you are 54-92 years old, get at least 1,000 mg of calcium and at least 600 mg of vitamin D per day.  If you are older than age 90 but younger than age 71, get at least 1,200 mg of calcium and at least 600 mg of vitamin D per day.  If you are older than age 84, get at least 1,200 mg of calcium and at least 800 mg of vitamin D per day. Smoking and drinking excessive alcohol increase the risk of osteoporosis. Eat foods that are rich in calcium and vitamin D, and do weight-bearing exercises several times each week as directed by your health care provider. How does menopause affect my mental health? Depression may occur at any age, but it is more common as you become older. Common symptoms of depression include:  Low or sad mood.  Changes in sleep patterns.  Changes in appetite or eating patterns.  Feeling an overall lack of motivation or enjoyment of activities that you previously enjoyed.  Frequent crying spells. Talk with your health care provider if you think that you are experiencing depression. General instructions See your health care provider for regular wellness exams and vaccines. This may include:  Scheduling regular health, dental, and eye exams.  Getting and maintaining your vaccines. These include: ? Influenza vaccine. Get this vaccine each year before the flu season begins. ? Pneumonia vaccine. ? Shingles vaccine. ? Tetanus, diphtheria, and pertussis (Tdap) booster vaccine. Your health care provider may also recommend other immunizations. Tell your health care provider if you have ever been abused or do not feel safe at home. Summary  Menopause is a normal process in which your ability to get pregnant comes to an end.  This condition causes hot flashes, night sweats, decreased  interest in sex, mood swings, headaches, or lack of sleep.  Treatment for this condition may include hormone replacement therapy.  Take actions to keep yourself healthy, including exercising regularly, eating a healthy diet, watching your weight, and checking your blood pressure and blood sugar levels.  Get screened for cancer and depression. Make sure that you are up to date with all your vaccines. This information is not intended to replace advice given to you by your health care provider. Make sure you discuss any questions you have with your health care provider. Document Revised: 08/21/2018 Document Reviewed: 08/21/2018 Elsevier Patient Education  2020 [employer].

## 2020-04-23 NOTE — Assessment & Plan Note (Addendum)
Now managed with telmisartan hctz. She has a history of   drug induced rash with amlodipine. Home readings 130/70 or less

## 2020-04-25 DIAGNOSIS — E261 Secondary hyperaldosteronism: Secondary | ICD-10-CM | POA: Insufficient documentation

## 2020-04-25 MED ORDER — SPIRONOLACTONE 25 MG PO TABS
25.0000 mg | ORAL_TABLET | Freq: Every day | ORAL | 1 refills | Status: DC
Start: 2020-04-25 — End: 2020-12-31

## 2020-04-25 NOTE — Assessment & Plan Note (Signed)
By  Biopsy.  Using clobetasol pre dermatology

## 2020-04-25 NOTE — Assessment & Plan Note (Signed)
Low GI diet recommended and printed handouts given.  Repeat labs  Every 6 months

## 2020-04-25 NOTE — Assessment & Plan Note (Signed)

## 2020-04-25 NOTE — Assessment & Plan Note (Signed)
I have addressed  BMI and recommended wt loss of 10% of body weigh over the next 6 months using a low glycemic index diet and regular exercise a minimum of 5 days per week.   

## 2020-04-25 NOTE — Assessment & Plan Note (Signed)
Suggested by hypertension and hypokalemia.  Adding spironolactone

## 2020-04-26 LAB — HIV ANTIBODY (ROUTINE TESTING W REFLEX): HIV 1&2 Ab, 4th Generation: NONREACTIVE

## 2020-05-04 ENCOUNTER — Telehealth: Payer: Self-pay

## 2020-05-04 NOTE — Telephone Encounter (Signed)
-----   Message from Nadara Mustard, MD sent at 05/02/2020  1:44 PM EDT ----- Regarding: MMG Received notice she has not received MMG yet as ordered at her Annual. Please check and encourage her to do this, and document conversation.

## 2020-05-04 NOTE — Telephone Encounter (Signed)
Left message to advise her to schedule her mammogram.

## 2020-06-29 ENCOUNTER — Other Ambulatory Visit: Payer: Self-pay | Admitting: Internal Medicine

## 2020-08-02 ENCOUNTER — Other Ambulatory Visit: Payer: Self-pay | Admitting: Obstetrics & Gynecology

## 2020-08-02 ENCOUNTER — Telehealth: Payer: Self-pay | Admitting: Obstetrics & Gynecology

## 2020-08-02 DIAGNOSIS — Z1231 Encounter for screening mammogram for malignant neoplasm of breast: Secondary | ICD-10-CM

## 2020-08-02 NOTE — Telephone Encounter (Signed)
Called and left voicemail for patient to call back to confirm scheduled annual with [facility]

## 2020-08-02 NOTE — Telephone Encounter (Signed)
-----   Message from Nadara Mustard, MD sent at 08/02/2020  7:48 AM EST ----- Regarding: Sch Annual in last Jan-Feb

## 2020-10-05 ENCOUNTER — Encounter: Payer: Self-pay | Admitting: *Deleted

## 2020-10-08 ENCOUNTER — Ambulatory Visit: Payer: BC Managed Care – PPO | Admitting: Obstetrics & Gynecology

## 2020-12-30 ENCOUNTER — Encounter: Payer: Self-pay | Admitting: Internal Medicine

## 2020-12-30 ENCOUNTER — Other Ambulatory Visit: Payer: Self-pay

## 2020-12-30 ENCOUNTER — Ambulatory Visit: Payer: BC Managed Care – PPO | Admitting: Internal Medicine

## 2020-12-30 VITALS — BP 122/84 | HR 79 | Temp 98.3°F | Resp 16 | Ht 65.0 in | Wt 292.0 lb

## 2020-12-30 DIAGNOSIS — Z1231 Encounter for screening mammogram for malignant neoplasm of breast: Secondary | ICD-10-CM | POA: Diagnosis not present

## 2020-12-30 DIAGNOSIS — R7303 Prediabetes: Secondary | ICD-10-CM | POA: Diagnosis not present

## 2020-12-30 DIAGNOSIS — I10 Essential (primary) hypertension: Secondary | ICD-10-CM

## 2020-12-30 DIAGNOSIS — Z23 Encounter for immunization: Secondary | ICD-10-CM

## 2020-12-30 DIAGNOSIS — E7800 Pure hypercholesterolemia, unspecified: Secondary | ICD-10-CM

## 2020-12-30 DIAGNOSIS — R5383 Other fatigue: Secondary | ICD-10-CM

## 2020-12-30 DIAGNOSIS — F32A Depression, unspecified: Secondary | ICD-10-CM | POA: Diagnosis not present

## 2020-12-30 LAB — COMPREHENSIVE METABOLIC PANEL
ALT: 12 U/L (ref 0–35)
AST: 15 U/L (ref 0–37)
Albumin: 3.8 g/dL (ref 3.5–5.2)
Alkaline Phosphatase: 104 U/L (ref 39–117)
BUN: 18 mg/dL (ref 6–23)
CO2: 33 mEq/L — ABNORMAL HIGH (ref 19–32)
Calcium: 9.2 mg/dL (ref 8.4–10.5)
Chloride: 98 mEq/L (ref 96–112)
Creatinine, Ser: 0.91 mg/dL (ref 0.40–1.20)
GFR: 70.23 mL/min (ref >60.00)
Glucose, Bld: 117 mg/dL — ABNORMAL HIGH (ref 70–99)
Potassium: 3.8 mEq/L (ref 3.5–5.1)
Sodium: 139 mEq/L (ref 135–145)
Total Bilirubin: 0.7 mg/dL (ref 0.2–1.2)
Total Protein: 7 g/dL (ref 6.0–8.3)

## 2020-12-30 LAB — CBC WITH DIFFERENTIAL/PLATELET
Basophils Absolute: 0 10*3/uL (ref 0.0–0.1)
Basophils Relative: 0.5 % (ref 0.0–3.0)
Eosinophils Absolute: 0.1 10*3/uL (ref 0.0–0.7)
Eosinophils Relative: 1.5 % (ref 0.0–5.0)
HCT: 37.9 % (ref 36.0–46.0)
Hemoglobin: 12.2 g/dL (ref 12.0–15.0)
Lymphocytes Relative: 23.7 % (ref 12.0–46.0)
Lymphs Abs: 1.7 10*3/uL (ref 0.7–4.0)
MCHC: 32.3 g/dL (ref 30.0–36.0)
MCV: 80.5 fl (ref 78.0–100.0)
Monocytes Absolute: 0.4 10*3/uL (ref 0.1–1.0)
Monocytes Relative: 4.9 % (ref 3.0–12.0)
Neutro Abs: 5 10*3/uL (ref 1.4–7.7)
Neutrophils Relative %: 69.4 % (ref 43.0–77.0)
Platelets: 212 10*3/uL (ref 150.0–400.0)
RBC: 4.71 Mil/uL (ref 3.87–5.11)
RDW: 15.8 % — ABNORMAL HIGH (ref 11.5–15.5)
WBC: 7.3 10*3/uL (ref 4.0–10.5)

## 2020-12-30 LAB — HEMOGLOBIN A1C: Hgb A1c MFr Bld: 6 % (ref 4.6–6.5)

## 2020-12-30 LAB — LIPID PANEL
Cholesterol: 152 mg/dL (ref 0–200)
HDL: 37.1 mg/dL — ABNORMAL LOW (ref >39.00)
LDL Cholesterol: 95 mg/dL (ref 0–99)
NonHDL: 114.56
Total CHOL/HDL Ratio: 4
Triglycerides: 96 mg/dL (ref 0.0–149.0)
VLDL: 19.2 mg/dL (ref 0.0–40.0)

## 2020-12-30 NOTE — Progress Notes (Signed)
Patient ID: Amy House, female    DOB: 25-Mar-1966  Age: 55 y.o. MRN: [id number]  The patient is here for annual  wellness examination and management of other chronic and acute problems.   The risk factors are reflected in the social history  The roster of all physicians providing medical care to patient - is listed in the Snapshot section of the chart.  Activities of daily living:  The patient is 100% independent in all ADLs: dressing, toileting, feeding as well as independent mobility  Home safety : The patient has smoke detectors in the home. They wear seatbelts.  There are no firearms at home. There is no violence in the home.   There is no risks for hepatitis, STDs or HIV. There is no   history of blood transfusion. They have no travel history to infectious disease endemic areas of the world.  The patient has seen their dentist in the last six month. They have seen their eye doctor in the last year. They admit to slight hearing difficulty with regard to whispered voices and some television programs.  They have deferred audiologic testing in the last year.  They do not  have excessive sun exposure. Discussed the need for sun protection: hats, long sleeves and use of sunscreen if there is significant sun exposure.   Diet: the importance of a healthy diet is discussed. She has a healthy diet, but is not exercising and refuses to be weighed.   The benefits of regular aerobic exercise were discussed.   Depression screen: there are multiple  signs or vegative symptoms of depression- irritability, change in appetite, anhedonia, sadness/tearfullness, lack of motivation, and anxiety.    The following portions of the patient's history were reviewed and updated as appropriate: allergies, current medications, past family history, past medical history,  past surgical history, past social history  and problem list.  Visual acuity was not assessed per patient preference since she has regular  follow up with her ophthalmologist. Hearing and body mass index were assessed and reviewed.   During the course of the visit the patient was educated and counseled about appropriate screening and preventive services including : fall prevention , diabetes screening, nutrition counseling, colorectal cancer screening, and recommended immunizations.    CC: The primary encounter diagnosis was Encounter for screening mammogram for malignant neoplasm of breast. Diagnoses of Primary hypertension, Pure hypercholesterolemia, Prediabetes, COVID-19 vaccine administered, and Fatigue due to depression were also pertinent to this visit.  1) positive depression screen: patient declines treatment and discussion of events other than that her fear of becoming infected COVID is increasing her anxiety  And aggravating her depression .   "I just have a lot going on."  She initially declined to have labs drawn but reluctantly agreed to have fasting panel done.   History Amy House has a past medical history of Acanthosis nigricans, Bradycardia, Dermatitis, Hyperlipidemia, Hypertension, and Obesity (BMI 30-39.9).   She has a past surgical history that includes Abdominal hysterectomy; Tonsillectomy (1979); and Skin biopsy.   Her family history includes Diabetes in her mother.She reports that she quit smoking about 27 years ago. Her smoking use included cigarettes. She smoked 0.50 packs per day. She has never used smokeless tobacco. She reports that she does not drink alcohol and does not use drugs.  Outpatient Medications Prior to Visit  Medication Sig Dispense Refill  . aspirin 81 MG tablet Take 81 mg by mouth daily.    . hydroquinone 4 % cream Apply topically.    Marland Kitchen  mometasone (ELOCON) 0.1 % ointment Apply topically 2 (two) times daily as needed.    Marland Kitchen telmisartan-hydrochlorothiazide (MICARDIS HCT) 80-25 MG tablet TAKE 1 TABLET BY MOUTH DAILY 90 tablet 1  . triamcinolone ointment (KENALOG) 0.1 %     . valACYclovir  (VALTREX) 500 MG tablet Take 1 tablet (500 mg total) by mouth daily. 30 tablet 1  . hydrOXYzine (ATARAX/VISTARIL) 25 MG tablet Take 25-50 mg by mouth at bedtime as needed. (Patient not taking: Reported on 12/30/2020)    . clobetasol ointment (TEMOVATE) 0.05 %  (Patient not taking: Reported on 12/30/2020)    . ergocalciferol (VITAMIN D2) 50000 units capsule Take 1 capsule (50,000 Units total) by mouth once a week. (Patient not taking: Reported on 12/30/2020) 12 capsule 0  . Halobetasol Propionate (LEXETTE) 0.05 % FOAM Apply AS directed TWICE DAILY TO AFFECTED AREA(S) UP TO THREE wks. AVOID AVOID FACE, GROIN, AND AXILLAE (Patient not taking: Reported on 12/30/2020)    . hydrOXYzine (ATARAX/VISTARIL) 10 MG tablet  (Patient not taking: Reported on 12/30/2020)    . spironolactone (ALDACTONE) 25 MG tablet Take 1 tablet (25 mg total) by mouth daily. (Patient not taking: Reported on 12/30/2020) 90 tablet 1  . tretinoin (RETIN-A) 0.025 % cream APPLY THIN LAYER TO FACE QHS. WASH OFF QAM (Patient not taking: Reported on 12/30/2020)     No facility-administered medications prior to visit.    Review of Systems   Patient denies headache, fevers, malaise, unintentional weight loss, skin rash, eye pain, sinus congestion and sinus pain, sore throat, dysphagia,  hemoptysis , cough, dyspnea, wheezing, chest pain, palpitations, orthopnea, edema, abdominal pain, nausea, melena, diarrhea, constipation, flank pain, dysuria, hematuria, urinary  Frequency, nocturia, numbness, tingling, seizures,  Focal weakness, Loss of consciousness,  Tremor, insomnia, depression, anxiety, and suicidal ideation.      Objective:  BP 122/84 (BP Location: Left Arm, Patient Position: Sitting, Cuff Size: Large)   Pulse 79   Temp 98.3 F (36.8 C) (Oral)   Resp 16   Ht 5\' 5"  (1.651 m)   Wt 292 lb (132.5 kg)   SpO2 98%   BMI 48.59 kg/m   Physical Exam   General appearance: alert, cooperative and appears stated age Ears: normal TM's and  external ear canals both ears Throat: lips, mucosa, and tongue normal; teeth and gums normal Neck: no adenopathy, no carotid bruit, supple, symmetrical, trachea midline and thyroid not enlarged, symmetric, no tenderness/mass/nodules Back: symmetric, no curvature. ROM normal. No CVA tenderness. Lungs: clear to auscultation bilaterally Heart: regular rate and rhythm, S1, S2 normal, no murmur, click, rub or gallop Abdomen: soft, non-tender; bowel sounds normal; no masses,  no organomegaly Pulses: 2+ and symmetric Skin: Skin color, texture, turgor normal. No rashes or lesions Lymph nodes: Cervical, supraclavicular, and axillary nodes normal. Psych: affect anxious and at times tearful,  but, makes good eye contact. No fidgeting,   Speech is articulate.  Denies suicidal thoughts   Assessment & Plan:   Problem List Items Addressed This Visit      Unprioritized   Hyperlipidemia    Mild, LDL < 100,  HDL low but stable.  Continue  low glycemic mediterranean style diet   Lab Results  Component Value Date   CHOL 152 12/30/2020   HDL 37.10 (L) 12/30/2020   LDLCALC 95 12/30/2020   TRIG 96.0 12/30/2020   CHOLHDL 4 12/30/2020             Relevant Orders   Lipid panel (Completed)   Hypertension  Well controlled on current regimen of telmisartan/hct. Renal function stable, no changes today.  Lab Results  Component Value Date   CREATININE 0.91 12/30/2020   Lab Results  Component Value Date   NA 139 12/30/2020   K 3.8 12/30/2020   CL 98 12/30/2020   CO2 33 (H) 12/30/2020         Relevant Orders   Comprehensive metabolic panel (Completed)   Prediabetes   Relevant Orders   Hemoglobin A1c (Completed)    Other Visit Diagnoses    Encounter for screening mammogram for malignant neoplasm of breast    -  Primary   Relevant Orders   MM DIGITAL SCREENING BILATERAL   COVID-19 vaccine administered       Relevant Orders   SARS-CoV-2 Semi-Quantitative Total Antibody, Spike    Fatigue due to depression       Relevant Orders   CBC with Differential/Platelet (Completed)      I have discontinued Darrin Nipper. Amy House's ergocalciferol, clobetasol ointment, tretinoin, Lexette, and spironolactone. I am also having her maintain her aspirin, triamcinolone ointment, valACYclovir, hydrOXYzine, telmisartan-hydrochlorothiazide, mometasone, and hydroquinone.  No orders of the defined types were placed in this encounter.   Medications Discontinued During This Encounter  Medication Reason  . ergocalciferol (VITAMIN D2) 50000 units capsule   . hydrOXYzine (ATARAX/VISTARIL) 10 MG tablet   . clobetasol ointment (TEMOVATE) 0.05 %   . Halobetasol Propionate (LEXETTE) 0.05 % FOAM   . tretinoin (RETIN-A) 0.025 % cream   . spironolactone (ALDACTONE) 25 MG tablet     Follow-up: No follow-ups on file.   Sherlene Shams, MD

## 2020-12-30 NOTE — Patient Instructions (Addendum)
I have ordered your mammogram to be done in late June at [facility]   Please think about treating your depression.  I would recommend wellbutrin ,  Because it will not cause weight gain or fatigue It is actually  stimulation and helps you control your thoughts  And focus better.   Let me know what creams you need refilled .   Health Maintenance for Postmenopausal Women Menopause is a normal process in which your ability to get pregnant comes to an end. This process happens slowly over many months or years, usually between the ages of 81 and 22. Menopause is complete when you have missed your menstrual periods for 12 months. It is important to talk with your health care provider about some of the most common conditions that affect women after menopause (postmenopausal women). These include heart disease, cancer, and bone loss (osteoporosis). Adopting a healthy lifestyle and getting preventive care can help to promote your health and wellness. The actions you take can also lower your chances of developing some of these common conditions. What should I know about menopause? During menopause, you may get a number of symptoms, such as:  Hot flashes. These can be moderate or severe.  Night sweats.  Decrease in sex drive.  Mood swings.  Headaches.  Tiredness.  Irritability.  Memory problems.  Insomnia. Choosing to treat or not to treat these symptoms is a decision that you make with your health care provider. Do I need hormone replacement therapy?  Hormone replacement therapy is effective in treating symptoms that are caused by menopause, such as hot flashes and night sweats.  Hormone replacement carries certain risks, especially as you become older. If you are thinking about using estrogen or estrogen with progestin, discuss the benefits and risks with your health care provider. What is my risk for heart disease and stroke? The risk of heart disease, heart attack, and stroke  increases as you age. One of the causes may be a change in the body's hormones during menopause. This can affect how your body uses dietary fats, triglycerides, and cholesterol. Heart attack and stroke are medical emergencies. There are many things that you can do to help prevent heart disease and stroke. Watch your blood pressure  High blood pressure causes heart disease and increases the risk of stroke. This is more likely to develop in people who have high blood pressure readings, are of African descent, or are overweight.  Have your blood pressure checked: ? Every 3-5 years if you are 24-15 years of age. ? Every year if you are 55 years old or older. Eat a healthy diet  Eat a diet that includes plenty of vegetables, fruits, low-fat dairy products, and lean protein.  Do not eat a lot of foods that are high in solid fats, added sugars, or sodium.   Get regular exercise Get regular exercise. This is one of the most important things you can do for your health. Most adults should:  Try to exercise for at least 150 minutes each week. The exercise should increase your heart rate and make you sweat (moderate-intensity exercise).  Try to do strengthening exercises at least twice each week. Do these in addition to the moderate-intensity exercise.  Spend less time sitting. Even light physical activity can be beneficial. Other tips  Work with your health care provider to achieve or maintain a healthy weight.  Do not use any products that contain nicotine or tobacco, such as cigarettes, e-cigarettes, and chewing tobacco. If  you need help quitting, ask your health care provider.  Know your numbers. Ask your health care provider to check your cholesterol and your blood sugar (glucose). Continue to have your blood tested as directed by your health care provider. Do I need screening for cancer? Depending on your health history and family history, you may need to have cancer screening at different  stages of your life. This may include screening for:  Breast cancer.  Cervical cancer.  Lung cancer.  Colorectal cancer. What is my risk for osteoporosis? After menopause, you may be at increased risk for osteoporosis. Osteoporosis is a condition in which bone destruction happens more quickly than new bone creation. To help prevent osteoporosis or the bone fractures that can happen because of osteoporosis, you may take the following actions:  If you are 47-16 years old, get at least 1,000 mg of calcium and at least 600 mg of vitamin D per day.  If you are older than age 60 but younger than age 31, get at least 1,200 mg of calcium and at least 600 mg of vitamin D per day.  If you are older than age 102, get at least 1,200 mg of calcium and at least 800 mg of vitamin D per day. Smoking and drinking excessive alcohol increase the risk of osteoporosis. Eat foods that are rich in calcium and vitamin D, and do weight-bearing exercises several times each week as directed by your health care provider. How does menopause affect my mental health? Depression may occur at any age, but it is more common as you become older. Common symptoms of depression include:  Low or sad mood.  Changes in sleep patterns.  Changes in appetite or eating patterns.  Feeling an overall lack of motivation or enjoyment of activities that you previously enjoyed.  Frequent crying spells. Talk with your health care provider if you think that you are experiencing depression. General instructions See your health care provider for regular wellness exams and vaccines. This may include:  Scheduling regular health, dental, and eye exams.  Getting and maintaining your vaccines. These include: ? Influenza vaccine. Get this vaccine each year before the flu season begins. ? Pneumonia vaccine. ? Shingles vaccine. ? Tetanus, diphtheria, and pertussis (Tdap) booster vaccine. Your health care provider may also recommend other  immunizations. Tell your health care provider if you have ever been abused or do not feel safe at home. Summary  Menopause is a normal process in which your ability to get pregnant comes to an end.  This condition causes hot flashes, night sweats, decreased interest in sex, mood swings, headaches, or lack of sleep.  Treatment for this condition may include hormone replacement therapy.  Take actions to keep yourself healthy, including exercising regularly, eating a healthy diet, watching your weight, and checking your blood pressure and blood sugar levels.  Get screened for cancer and depression. Make sure that you are up to date with all your vaccines. This information is not intended to replace advice given to you by your health care provider. Make sure you discuss any questions you have with your health care provider. Document Revised: 08/21/2018 Document Reviewed: 08/21/2018 Elsevier Patient Education  2021 [employer].

## 2020-12-31 ENCOUNTER — Ambulatory Visit: Payer: Self-pay | Admitting: Obstetrics & Gynecology

## 2020-12-31 NOTE — Assessment & Plan Note (Addendum)
Well controlled on current regimen of telmisartan/hct. Renal function stable, no changes today.  Lab Results  Component Value Date   CREATININE 0.91 12/30/2020   Lab Results  Component Value Date   NA 139 12/30/2020   K 3.8 12/30/2020   CL 98 12/30/2020   CO2 33 (H) 12/30/2020

## 2020-12-31 NOTE — Assessment & Plan Note (Signed)
Mild, LDL < 100,  HDL low but stable.  Continue  low glycemic mediterranean style diet   Lab Results  Component Value Date   CHOL 152 12/30/2020   HDL 37.10 (L) 12/30/2020   LDLCALC 95 12/30/2020   TRIG 96.0 12/30/2020   CHOLHDL 4 12/30/2020

## 2021-01-01 ENCOUNTER — Other Ambulatory Visit: Payer: Self-pay | Admitting: Internal Medicine

## 2021-01-03 LAB — SARS-COV-2 SEMI-QUANTITATIVE TOTAL ANTIBODY, SPIKE: SARS COV2 AB, Total Spike Semi QN: 1663 U/mL — ABNORMAL HIGH (ref <0.8)

## 2021-02-09 ENCOUNTER — Telehealth: Payer: Self-pay | Admitting: Internal Medicine

## 2021-02-21 LAB — HM MAMMOGRAPHY

## 2021-03-11 ENCOUNTER — Other Ambulatory Visit: Payer: Self-pay

## 2021-03-11 ENCOUNTER — Encounter: Payer: Self-pay | Admitting: Internal Medicine

## 2021-03-11 ENCOUNTER — Telehealth (INDEPENDENT_AMBULATORY_CARE_PROVIDER_SITE_OTHER): Payer: BC Managed Care – PPO | Admitting: Internal Medicine

## 2021-03-11 VITALS — Ht 65.0 in | Wt 292.0 lb

## 2021-03-11 DIAGNOSIS — R0982 Postnasal drip: Secondary | ICD-10-CM | POA: Diagnosis not present

## 2021-03-11 DIAGNOSIS — K219 Gastro-esophageal reflux disease without esophagitis: Secondary | ICD-10-CM

## 2021-03-11 DIAGNOSIS — B379 Candidiasis, unspecified: Secondary | ICD-10-CM

## 2021-03-11 DIAGNOSIS — J309 Allergic rhinitis, unspecified: Secondary | ICD-10-CM

## 2021-03-11 DIAGNOSIS — J321 Chronic frontal sinusitis: Secondary | ICD-10-CM

## 2021-03-11 MED ORDER — FLUCONAZOLE 150 MG PO TABS: 150.0000 mg | ORAL_TABLET | Freq: Once | ORAL | 0 refills | Status: AC

## 2021-03-11 MED ORDER — IPRATROPIUM BROMIDE 0.03 % NA SOLN: 2.0000 | Freq: Three times a day (TID) | NASAL | 12 refills | Status: DC

## 2021-03-11 MED ORDER — FAMOTIDINE 20 MG PO TABS: 20.0000 mg | ORAL_TABLET | Freq: Every day | ORAL | 0 refills | Status: DC

## 2021-03-11 MED ORDER — CETIRIZINE HCL 10 MG PO TABS: 10.0000 mg | ORAL_TABLET | Freq: Every evening | ORAL | 11 refills | Status: DC | PRN

## 2021-03-11 MED ORDER — AZITHROMYCIN 250 MG PO TABS: ORAL_TABLET | ORAL | 0 refills | Status: AC

## 2021-03-11 NOTE — Progress Notes (Signed)
[facility] day weekend started with loss of voice, with head cold symptoms. Has been taking [employer] brand Mucinex Dm.   Cough ongoing.

## 2021-03-15 ENCOUNTER — Encounter: Payer: Self-pay | Admitting: Internal Medicine

## 2021-03-15 DIAGNOSIS — R0982 Postnasal drip: Secondary | ICD-10-CM | POA: Insufficient documentation

## 2021-03-15 DIAGNOSIS — K219 Gastro-esophageal reflux disease without esophagitis: Secondary | ICD-10-CM | POA: Insufficient documentation

## 2021-03-15 DIAGNOSIS — J309 Allergic rhinitis, unspecified: Secondary | ICD-10-CM | POA: Insufficient documentation

## 2021-03-15 NOTE — Progress Notes (Signed)
Virtual Visit via Video Note  I connected Amy House  on 03/11/21 at  3:30 PM EDT by a video enabled telemedicine application and verified that I am speaking with the correct person using two identifiers.  Location patient: home,  Location provider:work or home office Persons participating in the virtual visit: patient, provider  I discussed the limitations of evaluation and management by telemedicine and the availability of in person appointments. The patient expressed understanding and agreed to proceed.   HPI:  Acute telemedicine visit for : Memory day weekend had cough negative covid test since 01/2021 feels like head cold not a productive cough sx's overall better since 01/2021 but still there and she had felt weak. She does have allergies to pollen tried otc mucous relief [facility] and alkaseltzer cold. She does c/o PND and GERD   Rec try pepcid and zyrtec   ROS: See pertinent positives and negatives per HPI.  Past Medical History:  Diagnosis Date   Acanthosis nigricans    Bradycardia    Dermatitis    Hyperlipidemia    Hypertension    Obesity (BMI 30-39.9)     Past Surgical History:  Procedure Laterality Date   ABDOMINAL HYSTERECTOMY     SKIN BIOPSY     TONSILLECTOMY  1979     Current Outpatient Medications:    aspirin 81 MG tablet, Take 81 mg by mouth daily., Disp: , Rfl:    azithromycin (ZITHROMAX) 250 MG tablet, Take 2 tablets on day 1, then 1 tablet daily on days 2 through 5 with food, Disp: 6 tablet, Rfl: 0   cetirizine (ZYRTEC) 10 MG tablet, Take 1 tablet (10 mg total) by mouth at bedtime as needed for allergies., Disp: 30 tablet, Rfl: 11   famotidine (PEPCID) 20 MG tablet, Take 1 tablet (20 mg total) by mouth daily. To bid before food, Disp: 60 tablet, Rfl: 0   hydroquinone 4 % cream, Apply topically., Disp: , Rfl:    ipratropium (ATROVENT) 0.03 % nasal spray, Place 2 sprays into both nostrils 3 (three) times daily. As needed postnasal drip, Disp: 30 mL,  Rfl: 12   mometasone (ELOCON) 0.1 % ointment, Apply topically 2 (two) times daily as needed., Disp: , Rfl:    telmisartan-hydrochlorothiazide (MICARDIS HCT) 80-25 MG tablet, TAKE 1 TABLET BY MOUTH DAILY, Disp: 90 tablet, Rfl: 1   valACYclovir (VALTREX) 500 MG tablet, Take 1 tablet (500 mg total) by mouth daily., Disp: 30 tablet, Rfl: 1   hydrOXYzine (ATARAX/VISTARIL) 25 MG tablet, Take 25-50 mg by mouth at bedtime as needed. (Patient not taking: Reported on 03/11/2021), Disp: , Rfl:    triamcinolone ointment (KENALOG) 0.1 %, , Disp: , Rfl:   EXAM:  VITALS per patient if applicable:  GENERAL: alert, oriented, appears well and in no acute distress  HEENT: atraumatic, conjunttiva clear, no obvious abnormalities on inspection of external nose and ears  NECK: normal movements of the head and neck  LUNGS: on inspection no signs of respiratory distress, breathing rate appears normal, no obvious gross SOB, gasping or wheezing  CV: no obvious cyanosis  MS: moves all visible extremities without noticeable abnormality  PSYCH/NEURO: pleasant and cooperative, no obvious depression or anxiety, speech and thought processing grossly intact  ASSESSMENT AND PLAN:  Discussed the following assessment and plan:  Frontal sinusitis, unspecified chronicity - Plan: azithromycin (ZITHROMAX) 250 MG tablet  Postnasal drip - Plan: ipratropium (ATROVENT) 0.03 % nasal spray Consider cheratussin qhs prn   Yeast infection - Plan: fluconazole (DIFLUCAN) 150  MG tablet  Allergic rhinitis, unspecified seasonality, unspecified trigger - Plan: cetirizine (ZYRTEC) 10 MG tablet  Gastroesophageal reflux disease without esophagitis - Plan: famotidine (PEPCID) 20 MG tablet  -we discussed possible serious and likely etiologies, options for evaluation and workup, limitations of telemedicine visit vs in person visit, treatment, treatment risks and precautions. Pt prefers to treat via telemedicine empirically rather than in  person at this moment.      I discussed the assessment and treatment plan with the patient. The patient was provided an opportunity to ask questions and all were answered. The patient agreed with the plan and demonstrated an understanding of the instructions.    Time spent 20 minutes Bevelyn Buckles, MD

## 2021-06-13 ENCOUNTER — Telehealth: Payer: Self-pay | Admitting: Internal Medicine

## 2021-06-13 MED ORDER — TELMISARTAN-HCTZ 80-25 MG PO TABS: 1.0000 | ORAL_TABLET | Freq: Every day | ORAL | 1 refills | Status: DC

## 2021-06-13 NOTE — Telephone Encounter (Signed)
Patient called in to reschedule her 10/6 appointment and she has 2 weeks left of her telmisartan-hydrochlorothiazide (MICARDIS HCT) 80-25 MG tablet.She will be out of this medicine by 11/21 appointment.Please send to the [facility] in [place].

## 2021-06-15 ENCOUNTER — Telehealth: Payer: Self-pay | Admitting: Internal Medicine

## 2021-06-15 NOTE — Telephone Encounter (Signed)
Is there any where to work pt in this week

## 2021-06-15 NOTE — Telephone Encounter (Signed)
Patient is calling in to see if there are any sooner openings than her 11/21 due to a bump she recently found under her breast.Please advise.

## 2021-06-15 NOTE — Telephone Encounter (Signed)
LMTCB

## 2021-06-16 ENCOUNTER — Other Ambulatory Visit: Payer: Self-pay

## 2021-06-16 ENCOUNTER — Ambulatory Visit: Payer: BC Managed Care – PPO | Admitting: Internal Medicine

## 2021-06-16 ENCOUNTER — Encounter: Payer: Self-pay | Admitting: Internal Medicine

## 2021-06-16 DIAGNOSIS — N611 Abscess of the breast and nipple: Secondary | ICD-10-CM | POA: Insufficient documentation

## 2021-06-16 MED ORDER — SULFAMETHOXAZOLE-TRIMETHOPRIM 800-160 MG PO TABS: 1.0000 | ORAL_TABLET | Freq: Two times a day (BID) | ORAL | 0 refills | Status: DC

## 2021-06-16 NOTE — Assessment & Plan Note (Signed)
Prescribing Septra DS and warm compresses for one week.  advised not to squeeze or otherwise treat as a comedone. If it does not drain spontaneously despite enlarging and becomes painful, will need incision  & drainage.  continue use of antibacterial soap.  Use of probiotic advised for 3 weeks to lower risk of c dif colitis.

## 2021-06-16 NOTE — Progress Notes (Signed)
Subjective:  Patient ID: Amy House, female    DOB: [birth year]  Age: 55 y.o. MRN: [id number]  CC: The encounter diagnosis was Boil, breast.  HPI [name] presents for  Chief Complaint  Patient presents with   Breast Problem   This visit occurred during the SARS-CoV-2 public health emergency.  Safety protocols were in place, including screening questions prior to the visit, additional usage of staff PPE, and extensive cleaning of exam room while observing appropriate contact time as indicated for disinfecting solutions.   55 yr old female with history of type 2 dm,   remote history of recurrent skin boils during puberty presents with tender nodule found on Sunday during shower  nodule is under her right breast    Outpatient Medications Prior to Visit  Medication Sig Dispense Refill   aspirin 81 MG tablet Take 81 mg by mouth daily.     famotidine (PEPCID) 20 MG tablet Take 1 tablet (20 mg total) by mouth daily. To bid before food 60 tablet 0   hydroquinone 4 % cream Apply topically.     hydrOXYzine (ATARAX/VISTARIL) 25 MG tablet Take 25-50 mg by mouth at bedtime as needed.     mometasone (ELOCON) 0.1 % ointment Apply topically 2 (two) times daily as needed.     telmisartan-hydrochlorothiazide (MICARDIS HCT) 80-25 MG tablet Take 1 tablet by mouth daily. 90 tablet 1   triamcinolone ointment (KENALOG) 0.1 %      valACYclovir (VALTREX) 500 MG tablet Take 1 tablet (500 mg total) by mouth daily. 30 tablet 1   cetirizine (ZYRTEC) 10 MG tablet Take 1 tablet (10 mg total) by mouth at bedtime as needed for allergies. (Patient not taking: Reported on 06/16/2021) 30 tablet 11   ipratropium (ATROVENT) 0.03 % nasal spray Place 2 sprays into both nostrils 3 (three) times daily. As needed postnasal drip (Patient not taking: Reported on 06/16/2021) 30 mL 12   No facility-administered medications prior to visit.    Review of Systems;  Patient denies headache, fevers, malaise,  unintentional weight loss, skin rash, eye pain, sinus congestion and sinus pain, sore throat, dysphagia,  hemoptysis , cough, dyspnea, wheezing, chest pain, palpitations, orthopnea, edema, abdominal pain, nausea, melena, diarrhea, constipation, flank pain, dysuria, hematuria, urinary  Frequency, nocturia, numbness, tingling, seizures,  Focal weakness, Loss of consciousness,  Tremor, insomnia, depression, anxiety, and suicidal ideation.      Objective:  BP 136/84 (BP Location: Left Arm, Patient Position: Sitting, Cuff Size: Large)   Pulse 77   Temp (!) 95.4 F (35.2 C) (Temporal)   Ht 5\' 5"  (1.651 m)   Wt 292 lb (132.5 kg)   SpO2 95%   BMI 48.59 kg/m   BP Readings from Last 3 Encounters:  06/16/21 136/84  12/30/20 122/84  04/23/20 (!) 168/100    Wt Readings from Last 3 Encounters:  06/16/21 292 lb (132.5 kg)  03/11/21 292 lb (132.5 kg)  12/30/20 292 lb (132.5 kg)    General appearance: alert, cooperative and appears stated age Ears: normal TM's and external ear canals both ears Throat: lips, mucosa, and tongue normal; teeth and gums normal Neck: no adenopathy, no carotid bruit, supple, symmetrical, trachea midline and thyroid not enlarged, symmetric, no tenderness/mass/nodules Breasts:  pendulous , no masses  Back: symmetric, no curvature. ROM normal. No CVA tenderness. Lungs: clear to auscultation bilaterally Heart: regular rate and rhythm, S1, S2 normal, no murmur, click, rub or gallop Abdomen: soft, non-tender; bowel sounds normal; no masses,  no organomegaly Pulses: 2+ and symmetric Skin: non fluctuant boil under right breast . No rashes or other lesions Lymph nodes: Cervical, supraclavicular, and axillary nodes normal.  Lab Results  Component Value Date   HGBA1C 6.0 12/30/2020   HGBA1C 6.0 04/23/2020   HGBA1C 6.0 04/17/2019    Lab Results  Component Value Date   CREATININE 0.91 12/30/2020   CREATININE 0.85 04/23/2020   CREATININE 0.90 04/17/2019    Lab  Results  Component Value Date   WBC 7.3 12/30/2020   HGB 12.2 12/30/2020   HCT 37.9 12/30/2020   PLT 212.0 12/30/2020   GLUCOSE 117 (H) 12/30/2020   CHOL 152 12/30/2020   TRIG 96.0 12/30/2020   HDL 37.10 (L) 12/30/2020   LDLCALC 95 12/30/2020   ALT 12 12/30/2020   AST 15 12/30/2020   NA 139 12/30/2020   K 3.8 12/30/2020   CL 98 12/30/2020   CREATININE 0.91 12/30/2020   BUN 18 12/30/2020   CO2 33 (H) 12/30/2020   TSH 1.43 04/23/2020   HGBA1C 6.0 12/30/2020   MICROALBUR 1.8 04/17/2019    No results found.  Assessment & Plan:   Problem List Items Addressed This Visit       Unprioritized   Boil, breast    Prescribing Septra DS and warm compresses for one week.  advised not to squeeze or otherwise treat as a comedone. If it does not drain spontaneously despite enlarging and becomes painful, will need incision  & drainage.  continue use of antibacterial soap.  Use of probiotic advised for 3 weeks to lower risk of c dif colitis.       Relevant Medications   sulfamethoxazole-trimethoprim (BACTRIM DS) 800-160 MG tablet    I have discontinued Darrin Nipper. [place]'s ipratropium. I am also having her start on sulfamethoxazole-trimethoprim. Additionally, I am having her maintain her aspirin, triamcinolone ointment, valACYclovir, hydrOXYzine, mometasone, hydroquinone, cetirizine, famotidine, and telmisartan-hydrochlorothiazide.  Meds ordered this encounter  Medications   sulfamethoxazole-trimethoprim (BACTRIM DS) 800-160 MG tablet    Sig: Take 1 tablet by mouth 2 (two) times daily.    Dispense:  14 tablet    Refill:  0    Medications Discontinued During This Encounter  Medication Reason   ipratropium (ATROVENT) 0.03 % nasal spray     Follow-up: No follow-ups on file.   Sherlene Shams, MD

## 2021-06-16 NOTE — Patient Instructions (Signed)
The bump under your breast is a furuncle ("boil")  I am prescribing Septra Ds for one week . Take it twice daily with food  Use warm/hot compresses OFTEN TO ENCOURAGE LIQUIFICATION AND DRAINAGE  DO NOT SQUEEZE LIKE A PIMPLE.  YOU ARE PUSHING THE INFECTION DEEPER INTO THE TISSUES WHEN YOU DO THAT  IF IT BECOMES SWOLLEN AND PAINFUL AND WILL NOT DRAIN SPONTANEOUSLY,  IT WILL NEED TO BE CUT INTO OR DRAINED (URGENT CARE, ED, OR GENERAL SURGERY)   Please take a probiotic ( Align, [place] or Culturelle) or eat yogurt daily for a minimum of 3 weeks to prevent a serious antibiotic associated diarrhea  Called clostridium dificile colitis     Continue using Dial soap  Placing  a cotton pad under your breast will break the "flesh seal" that encourages infection .

## 2021-06-16 NOTE — Telephone Encounter (Signed)
Pt has been scheduled for this afternoon at 1:30

## 2021-08-01 ENCOUNTER — Ambulatory Visit: Payer: BC Managed Care – PPO | Admitting: Internal Medicine

## 2021-10-06 ENCOUNTER — Other Ambulatory Visit: Payer: Self-pay

## 2021-10-06 ENCOUNTER — Ambulatory Visit: Payer: BC Managed Care – PPO | Admitting: Internal Medicine

## 2021-10-06 ENCOUNTER — Encounter: Payer: Self-pay | Admitting: Internal Medicine

## 2021-10-06 VITALS — BP 140/80 | HR 78 | Temp 98.1°F | Ht 65.0 in | Wt 292.0 lb

## 2021-10-06 DIAGNOSIS — E261 Secondary hyperaldosteronism: Secondary | ICD-10-CM

## 2021-10-06 DIAGNOSIS — R5383 Other fatigue: Secondary | ICD-10-CM | POA: Diagnosis not present

## 2021-10-06 DIAGNOSIS — Z6841 Body Mass Index (BMI) 40.0 and over, adult: Secondary | ICD-10-CM

## 2021-10-06 DIAGNOSIS — I10 Essential (primary) hypertension: Secondary | ICD-10-CM | POA: Diagnosis not present

## 2021-10-06 DIAGNOSIS — E7800 Pure hypercholesterolemia, unspecified: Secondary | ICD-10-CM | POA: Diagnosis not present

## 2021-10-06 DIAGNOSIS — R7303 Prediabetes: Secondary | ICD-10-CM | POA: Diagnosis not present

## 2021-10-06 DIAGNOSIS — R0683 Snoring: Secondary | ICD-10-CM

## 2021-10-06 LAB — CBC WITH DIFFERENTIAL/PLATELET
Basophils Absolute: 0 10*3/uL (ref 0.0–0.1)
Basophils Relative: 0.5 % (ref 0.0–3.0)
Eosinophils Absolute: 0.2 10*3/uL (ref 0.0–0.7)
Eosinophils Relative: 2.1 % (ref 0.0–5.0)
HCT: 37.5 % (ref 36.0–46.0)
Hemoglobin: 12.1 g/dL (ref 12.0–15.0)
Lymphocytes Relative: 26.5 % (ref 12.0–46.0)
Lymphs Abs: 2 10*3/uL (ref 0.7–4.0)
MCHC: 32.3 g/dL (ref 30.0–36.0)
MCV: 81.1 fl (ref 78.0–100.0)
Monocytes Absolute: 0.4 10*3/uL (ref 0.1–1.0)
Monocytes Relative: 5.9 % (ref 3.0–12.0)
Neutro Abs: 5 10*3/uL (ref 1.4–7.7)
Neutrophils Relative %: 65 % (ref 43.0–77.0)
Platelets: 206 10*3/uL (ref 150.0–400.0)
RBC: 4.63 Mil/uL (ref 3.87–5.11)
RDW: 15.7 % — ABNORMAL HIGH (ref 11.5–15.5)
WBC: 7.7 10*3/uL (ref 4.0–10.5)

## 2021-10-06 LAB — LIPID PANEL
Cholesterol: 175 mg/dL (ref 0–200)
HDL: 38.3 mg/dL — ABNORMAL LOW (ref >39.00)
LDL Cholesterol: 119 mg/dL — ABNORMAL HIGH (ref 0–99)
NonHDL: 137.03
Total CHOL/HDL Ratio: 5
Triglycerides: 90 mg/dL (ref 0.0–149.0)
VLDL: 18 mg/dL (ref 0.0–40.0)

## 2021-10-06 LAB — COMPREHENSIVE METABOLIC PANEL
ALT: 13 U/L (ref 0–35)
AST: 18 U/L (ref 0–37)
Albumin: 4 g/dL (ref 3.5–5.2)
Alkaline Phosphatase: 106 U/L (ref 39–117)
BUN: 18 mg/dL (ref 6–23)
CO2: 31 mEq/L (ref 19–32)
Calcium: 9.1 mg/dL (ref 8.4–10.5)
Chloride: 99 mEq/L (ref 96–112)
Creatinine, Ser: 0.91 mg/dL (ref 0.40–1.20)
GFR: 69.85 mL/min (ref >60.00)
Glucose, Bld: 116 mg/dL — ABNORMAL HIGH (ref 70–99)
Potassium: 3.5 mEq/L (ref 3.5–5.1)
Sodium: 138 mEq/L (ref 135–145)
Total Bilirubin: 0.7 mg/dL (ref 0.2–1.2)
Total Protein: 7 g/dL (ref 6.0–8.3)

## 2021-10-06 LAB — MICROALBUMIN / CREATININE URINE RATIO
Creatinine,U: 59.8 mg/dL
Microalb Creat Ratio: 1.2 mg/g (ref 0.0–30.0)
Microalb, Ur: <0.7 mg/dL (ref 0.0–1.9)

## 2021-10-06 LAB — HEMOGLOBIN A1C: Hgb A1c MFr Bld: 6 % (ref 4.6–6.5)

## 2021-10-06 LAB — TSH: TSH: 1.26 u[IU]/mL (ref 0.35–5.50)

## 2021-10-06 MED ORDER — OZEMPIC (0.25 OR 0.5 MG/DOSE) 2 MG/1.5ML ~~LOC~~ SOPN: 0.2500 mg | PEN_INJECTOR | SUBCUTANEOUS | 2 refills | Status: DC

## 2021-10-06 NOTE — Progress Notes (Signed)
Subjective:  Patient ID: Amy House, female    DOB: 09-Apr-1966  Age: 56 y.o. MRN: [id number]  CC: The primary encounter diagnosis was Primary hypertension. Diagnoses of Pure hypercholesterolemia, Prediabetes, Other fatigue, Morbid obesity with BMI of 45.0-49.9, adult ([place]), Snoring, and Secondary hyperaldosteronism ([place]) were also pertinent to this visit.   This visit occurred during the SARS-CoV-2 public health emergency.  Safety protocols were in place, including screening questions prior to the visit, additional usage of staff PPE, and extensive cleaning of exam room while observing appropriate contact time as indicated for disinfecting solutions.    HPI Amy House presents for follow up on hypertension, prediabetes and obesity Chief Complaint  Patient presents with   Follow-up    3 month follow up   Hypertension: patient checks blood pressure twice weekly at home.  Readings have been for the most part < 140/80 at rest . Patient is following a reduced salt diet most days and is taking medications as prescribed   She is COVID vaccinated but has deferred boosters and flu vaccine.  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 Depression screen:  symptoms are more accurately defined as anxiety due to stress brought on by recurrent conflict  with a  contentious neighbor Santiago Glad") who has moved into her neighborhood of 26 years and taken her husband to court over a trivial matter (patient's husband Lanny Hurst was blowing his yard  leaves into the street) . Denies insomnia, hopelessness and panic attacks.  She is looking forward to her son's wedding/ Her son  Grayland Ormond  a Artist in the [employer],  is 58 yrs old and getting married in March.   Obesity and prediabetes:  she is requesting help losing weight,  knows about GLP 1 agonists from colleagues at work. No FH or personal history of thyroid CA or  pancreatitis.      Outpatient Medications Prior to Visit  Medication Sig Dispense Refill   aspirin 81 MG tablet Take 81 mg by mouth daily.     hydroquinone 4 % cream Apply topically.     mometasone (ELOCON) 0.1 % ointment Apply topically 2 (two) times daily as needed.     telmisartan-hydrochlorothiazide (MICARDIS HCT) 80-25 MG tablet Take 1 tablet by mouth daily. 90 tablet 1   triamcinolone ointment (KENALOG) 0.1 %      valACYclovir (VALTREX) 500 MG tablet Take 1 tablet (500 mg total) by mouth daily. 30 tablet 1   cetirizine (ZYRTEC) 10 MG tablet Take 1 tablet (10 mg total) by mouth at bedtime as needed for allergies. (Patient not taking: Reported on 06/16/2021) 30 tablet 11   famotidine (PEPCID) 20 MG tablet Take 1 tablet (20 mg total) by mouth daily. To bid before food (Patient not taking: Reported on 10/06/2021) 60 tablet 0   hydrOXYzine (ATARAX/VISTARIL) 25 MG tablet Take 25-50 mg by mouth at bedtime as needed. (Patient not taking: Reported on 10/06/2021)     sulfamethoxazole-trimethoprim (BACTRIM DS) 800-160 MG tablet Take 1 tablet by mouth 2 (two) times daily. (Patient not taking: Reported on 10/06/2021) 14 tablet 0   No facility-administered medications prior to visit.    Review of Systems;  Patient denies headache, fevers, malaise, unintentional weight loss, skin rash, eye pain, sinus congestion and sinus pain, sore throat, dysphagia,  hemoptysis , cough, dyspnea, wheezing, chest pain, palpitations, orthopnea, edema, abdominal pain, nausea, melena, diarrhea, constipation, flank pain, dysuria, hematuria, urinary  Frequency, nocturia, numbness, tingling, seizures,  Focal weakness, Loss of consciousness,  Tremor, insomnia, depression, anxiety, and suicidal ideation.      Objective:  BP 140/80 (BP Location: Left Arm, Patient Position: Sitting, Cuff Size: Large)    Pulse 78    Temp 98.1 F (36.7 C) (Oral)    Ht '5\' 5"'  (1.651 m)    Wt 292 lb (132.5 kg)    SpO2 98%    BMI 48.59 kg/m   BP  Readings from Last 3 Encounters:  10/06/21 140/80  06/16/21 136/84  12/30/20 122/84    Wt Readings from Last 3 Encounters:  10/06/21 292 lb (132.5 kg)  06/16/21 292 lb (132.5 kg)  03/11/21 292 lb (132.5 kg)    General appearance: alert, cooperative and appears stated age Ears: normal TM's and external ear canals both ears Throat: lips, mucosa, and tongue normal; teeth and gums normal Neck: no adenopathy, no carotid bruit, supple, symmetrical, trachea midline and thyroid not enlarged, symmetric, no tenderness/mass/nodules Back: symmetric, no curvature. ROM normal. No CVA tenderness. Lungs: clear to auscultation bilaterally Heart: regular rate and rhythm, S1, S2 normal, no murmur, click, rub or gallop Abdomen: soft, non-tender; bowel sounds normal; no masses,  no organomegaly Pulses: 2+ and symmetric Skin: Skin color, texture, turgor normal. No rashes or lesions Lymph nodes: Cervical, supraclavicular, and axillary nodes normal.  Lab Results  Component Value Date   HGBA1C 6.0 10/06/2021   HGBA1C 6.0 12/30/2020   HGBA1C 6.0 04/23/2020    Lab Results  Component Value Date   CREATININE 0.91 10/06/2021   CREATININE 0.91 12/30/2020   CREATININE 0.85 04/23/2020    Lab Results  Component Value Date   WBC 7.7 10/06/2021   HGB 12.1 10/06/2021   HCT 37.5 10/06/2021   PLT 206.0 10/06/2021   GLUCOSE 116 (H) 10/06/2021   CHOL 175 10/06/2021   TRIG 90.0 10/06/2021   HDL 38.30 (L) 10/06/2021   LDLCALC 119 (H) 10/06/2021   ALT 13 10/06/2021   AST 18 10/06/2021   NA 138 10/06/2021   K 3.5 10/06/2021   CL 99 10/06/2021   CREATININE 0.91 10/06/2021   BUN 18 10/06/2021   CO2 31 10/06/2021   TSH 1.26 10/06/2021   HGBA1C 6.0 10/06/2021   MICROALBUR <0.7 10/06/2021    No results found.  Assessment & Plan:   Problem List Items Addressed This Visit     Hypertension - Primary    Well controlled on telmisartan/hct regimen. Renal function stable, no changes today.  Lab Results   Component Value Date   NA 138 10/06/2021   K 3.5 10/06/2021   CL 99 10/06/2021   CO2 31 10/06/2021   Lab Results  Component Value Date   CREATININE 0.91 10/06/2021         Relevant Orders   Comp Met (CMET) (Completed)   Urine Microalbumin w/creat. ratio (Completed)   Morbid obesity with BMI of 45.0-49.9, adult ([facility])    With prediabetes (a1c 6.0, fasting glucose 116), hypertension and probable OSA (snoring history ).  She has trouble suppressing appetite and eating junk food and requesting Wegovy.  Patient has no contraindications to using this medication.  The risks  and benefits were reviewed, and instructions on titration of dose were outlined.  .       Relevant Medications   Semaglutide,0.25 or 0.5MG/DOS, (OZEMPIC, 0.25 OR 0.5 MG/DOSE,) 2 MG/1.5ML SOPN   Hyperlipidemia    Mild, LDL  Has risen slightly,  And HDL remains low.  Continue  low glycemic mediterranean style diet and weight reduction attempts.   Lab Results  Component Value Date   CHOL 175 10/06/2021   HDL 38.30 (L) 10/06/2021   LDLCALC 119 (H) 10/06/2021   TRIG 90.0 10/06/2021   CHOLHDL 5 10/06/2021             Relevant Orders   Lipid Profile (Completed)   Prediabetes    A1c Is stable. .  She is embarking on weight reduction therapy with Wegovy .  Repeat labs  Every 6 months       Relevant Orders   HgB A1c (Completed)   Urine Microalbumin w/creat. ratio (Completed)   Snoring    She has referred for sleep study in 2019 but deferred.       RESOLVED: Secondary hyperaldosteronism ([place])    Managed with spironolactone added to antihypertensive regimen.  Potassium level remains normal.   Lab Results  Component Value Date   NA 138 10/06/2021   K 3.5 10/06/2021   CL 99 10/06/2021   CO2 31 10/06/2021   Lab Results  Component Value Date   CREATININE 0.91 10/06/2021         Other Visit Diagnoses     Other fatigue       Relevant Orders   CBC with Differential/Platelet (Completed)   TSH  (Completed)       I spent  40 minutes dedicated to the care of this patient on the date of this encounter to include pre-visit review of patient's medical history,  most recent imaging studies, Face-to-face time with the patient , and post visit ordering of testing and therapeutics.    Follow-up: No follow-ups on file.   Crecencio Mc, MD

## 2021-10-06 NOTE — Patient Instructions (Signed)
I am  prescribing  Ozempic  to help you lose weight  . The first  pen  contains 6  WEEKLY doses of  the starting dose of 0.25 mg     ozempic is a medication that is taken as a weekly subcutaneous injection. It is not insulin.  It  causes your pancreas to increase its  own insulin secretion  And also slows down the emptying of your stomach,  So it decreases your appetite and helps you lose weight.  The dose for the first 4 weekly doses is 0.25 mg.  You may have mild nausea on the first or second day but this should resolve.  If not  ,  stop the medication.   As long as you are losing weight,  you can continue the dose you are on .  Only increase the dose to 0.5 mg after 4 weeks if your weight has plateaued.  Let me know when you need a refill and what dose you are taking.

## 2021-10-08 NOTE — Assessment & Plan Note (Signed)
Well controlled on telmisartan/hct regimen. Renal function stable, no changes today.  Lab Results  Component Value Date   NA 138 10/06/2021   K 3.5 10/06/2021   CL 99 10/06/2021   CO2 31 10/06/2021   Lab Results  Component Value Date   CREATININE 0.91 10/06/2021

## 2021-10-08 NOTE — Assessment & Plan Note (Addendum)
She has referred for sleep study in 2019 but deferred.  

## 2021-10-08 NOTE — Assessment & Plan Note (Signed)
Managed with spironolactone added to antihypertensive regimen.  Potassium level remains normal.   Lab Results  Component Value Date   NA 138 10/06/2021   K 3.5 10/06/2021   CL 99 10/06/2021   CO2 31 10/06/2021   Lab Results  Component Value Date   CREATININE 0.91 10/06/2021

## 2021-10-08 NOTE — Assessment & Plan Note (Signed)
With prediabetes (a1c 6.0, fasting glucose 116), hypertension and probable OSA (snoring history ).  She has trouble suppressing appetite and eating junk food and requesting Wegovy.  Patient has no contraindications to using this medication.  The risks  and benefits were reviewed, and instructions on titration of dose were outlined.  Marland Kitchen

## 2021-10-08 NOTE — Assessment & Plan Note (Signed)
A1c Is stable. .  She is embarking on weight reduction therapy with Wegovy .  Repeat labs  Every 6 months

## 2021-10-08 NOTE — Assessment & Plan Note (Signed)
Mild, LDL  Has risen slightly,  And HDL remains low.  Continue  low glycemic mediterranean style diet and weight reduction attempts.   Lab Results  Component Value Date   CHOL 175 10/06/2021   HDL 38.30 (L) 10/06/2021   LDLCALC 119 (H) 10/06/2021   TRIG 90.0 10/06/2021   CHOLHDL 5 10/06/2021

## 2021-10-17 ENCOUNTER — Encounter: Payer: Self-pay | Admitting: Internal Medicine

## 2021-10-18 ENCOUNTER — Other Ambulatory Visit: Payer: Self-pay | Admitting: Internal Medicine

## 2021-10-18 DIAGNOSIS — U071 COVID-19: Secondary | ICD-10-CM | POA: Insufficient documentation

## 2021-10-18 HISTORY — DX: COVID-19: U07.1

## 2021-10-18 MED ORDER — MOLNUPIRAVIR EUA 200MG CAPSULE: 4.0000 | ORAL_CAPSULE | Freq: Two times a day (BID) | ORAL | 0 refills | Status: AC

## 2021-11-24 ENCOUNTER — Telehealth: Payer: Self-pay | Admitting: Internal Medicine

## 2021-11-24 NOTE — Telephone Encounter (Signed)
Called patient and lm, patient's visit on 10/06/2021 was a 30m follow up. All coding is correct and there is no outstanding amount owed.  ?

## 2021-12-24 ENCOUNTER — Other Ambulatory Visit: Payer: Self-pay | Admitting: Internal Medicine

## 2021-12-26 ENCOUNTER — Encounter: Payer: Self-pay | Admitting: Internal Medicine

## 2021-12-30 MED ORDER — WEGOVY 0.5 MG/0.5ML ~~LOC~~ SOAJ: 0.5000 mg | SUBCUTANEOUS | 2 refills | Status: DC

## 2022-01-03 ENCOUNTER — Telehealth: Payer: Self-pay

## 2022-01-03 NOTE — Telephone Encounter (Signed)
PA for Wegovy has been submitted on covermymeds.  °

## 2022-01-16 NOTE — Telephone Encounter (Signed)
PA has been approved through 08/05/2022. ? ?LMTCB to let pt know that the [facility] has been approved.  ?

## 2022-01-16 NOTE — Telephone Encounter (Signed)
Pt returning call... Advised pt of below note... Pt stated that she pickup the [facility] last week... Pt stated that she start [facility] on 01/16/2022....  ?

## 2022-01-17 NOTE — Telephone Encounter (Signed)
noted 

## 2022-02-09 ENCOUNTER — Other Ambulatory Visit: Payer: Self-pay

## 2022-02-28 LAB — HM MAMMOGRAPHY

## 2022-03-21 ENCOUNTER — Other Ambulatory Visit: Payer: Self-pay

## 2022-03-21 ENCOUNTER — Telehealth: Payer: Self-pay

## 2022-03-21 DIAGNOSIS — I10 Essential (primary) hypertension: Secondary | ICD-10-CM

## 2022-03-21 DIAGNOSIS — R7303 Prediabetes: Secondary | ICD-10-CM

## 2022-03-21 DIAGNOSIS — E7800 Pure hypercholesterolemia, unspecified: Secondary | ICD-10-CM

## 2022-03-21 MED ORDER — TELMISARTAN-HCTZ 80-25 MG PO TABS: 1.0000 | ORAL_TABLET | Freq: Every day | ORAL | 1 refills | Status: DC

## 2022-03-21 NOTE — Telephone Encounter (Signed)
Patient scheduled an appointment to see Dr. Duncan Dull on 03/27/2022.  Patient states she would like to know if she needs to have her blood drawn before this visit.    Patient states she needs a prescription refill for telmisartan-hydrochlorothiazide (MICARDIS HCT) 80-25 MG tablet.  *Patient states her preferred pharmacy is [facility] in [place].

## 2022-03-21 NOTE — Telephone Encounter (Signed)
I pended orders for CMP, Lipid panel, LDL, and A1c. Is there anything else that needs to be ordered before her appt on 03/27/2022? Medication has been refilled.

## 2022-03-27 ENCOUNTER — Ambulatory Visit: Payer: BC Managed Care – PPO | Admitting: Internal Medicine

## 2022-03-30 ENCOUNTER — Ambulatory Visit (INDEPENDENT_AMBULATORY_CARE_PROVIDER_SITE_OTHER): Payer: BC Managed Care – PPO | Admitting: Obstetrics

## 2022-03-30 ENCOUNTER — Other Ambulatory Visit (HOSPITAL_COMMUNITY)
Admission: RE | Admit: 2022-03-30 | Discharge: 2022-03-30 | Disposition: A | Discharge status: Home / Self Care | Payer: BC Managed Care – PPO | Source: Ambulatory Visit | Attending: Obstetrics | Admitting: Obstetrics

## 2022-03-30 VITALS — BP 136/84 | Ht 65.0 in | Wt 296.0 lb

## 2022-03-30 DIAGNOSIS — N898 Other specified noninflammatory disorders of vagina: Secondary | ICD-10-CM | POA: Insufficient documentation

## 2022-03-30 MED ORDER — FLUCONAZOLE 150 MG PO TABS: 150.0000 mg | ORAL_TABLET | Freq: Once | ORAL | 1 refills | Status: AC

## 2022-03-30 NOTE — Progress Notes (Signed)
[name] is a 56 y.o. G1P1001 who LMP was No LMP recorded. Patient has had a hysterectomy., presents today for a problem visit.   Patient complains of an abnormal vaginal discharge for 4 weeks. Discharge described as: scant and thin. Vaginal symptoms include local irritation.   Other associated symptoms: vulvar itching.Amy House has had a hysterectomy.Menstrual pattern:Contraception: status post hysterectomy.  She denies recent antibiotic exposure, denies changes in soaps, detergents coinciding with the onset of her symptoms.  She has not previously self treated or been under treatment by another provider for these symptoms.   She ha not had a Well Woman exam in 2 years.  O:  BP 136/84   Ht 5\' 5"  (1.651 m)   Wt 296 lb (134.3 kg)   BMI 49.26 kg/m  Recent Results (from the past 2160 hour(s))  HM MAMMOGRAPHY     Status: None   Collection Time: 02/28/22 12:00 AM  Result Value Ref Range   HM Mammogram 0-4 Bi-Rad 0-4 Bi-Rad, Self Reported Normal  Cervicovaginal ancillary only     Status: None   Collection Time: 03/30/22 11:03 AM  Result Value Ref Range   Bacterial Vaginitis (gardnerella) Negative    Candida Vaginitis Negative    Candida Glabrata Negative    Comment      Normal Reference Range Bacterial Vaginosis - Negative   Comment Normal Reference Range Candida Species - Negative    Comment Normal Reference Range Candida Galbrata - Negative    Review of Systems  Constitutional: Negative.   HENT: Negative.    Eyes: Negative.   Respiratory: Negative.    Cardiovascular: Negative.   Gastrointestinal: Negative.   Genitourinary: Negative.   Skin:        Vaginal itching  Neurological: Negative.   Endo/Heme/Allergies: Negative.   Psychiatric/Behavioral: Negative.     Physical Activity: Not on file   Physical Exam Constitutional:      Appearance: Normal appearance. She is obese.  HENT:     Head: Normocephalic and atraumatic.  Cardiovascular:     Rate and Rhythm: Normal  rate and regular rhythm.  Pulmonary:     Effort: Pulmonary effort is normal.     Breath sounds: Normal breath sounds.  Genitourinary:    General: Normal vulva.     Rectum: Normal.     Comments: No external lesions or areas of irritation. Speculum exam reveals normal vaginal rugae- no obvious discharge noted ? Cervical cuff noted ( hx of hysterectomy)  Aptima swab retrieved and sent to lab. Skin:    General: Skin is warm and dry.  Neurological:     Mental Status: She is alert.  Psychiatric:        Mood and Affect: Mood normal.        Behavior: Behavior normal.    A: vagial itching and irritation.   1) Risk factors for bacterial vaginosis and candida infections discussed.  We discussed normal vaginal flora/microbiome.  Any factors that may alter the microbiome increase the risk of these opportunistic infections.  These include changes in pH, antibiotic exposures, diabetes, wet bathing suits etc.  We discussed that treatment is aimed at eradicating abnormal bacterial overgrowth and or yeast.  There may be some role for vaginal probiotics in restoring normal vaginal flora.     Rx for Diflucan sent. Will review the lab results. Follow up PRN.  04/01/22, CNM  04/04/2022 4:24 PM

## 2022-03-31 LAB — CERVICOVAGINAL ANCILLARY ONLY
Bacterial Vaginitis (gardnerella): NEGATIVE
Candida Glabrata: NEGATIVE
Candida Vaginitis: NEGATIVE
Comment: NEGATIVE
Comment: NEGATIVE
Comment: NEGATIVE

## 2022-04-08 ENCOUNTER — Encounter: Payer: Self-pay | Admitting: Obstetrics

## 2022-05-11 ENCOUNTER — Telehealth: Payer: Self-pay

## 2022-05-11 ENCOUNTER — Ambulatory Visit: Payer: BC Managed Care – PPO | Admitting: Internal Medicine

## 2022-05-11 ENCOUNTER — Encounter: Payer: Self-pay | Admitting: Internal Medicine

## 2022-05-11 VITALS — BP 138/94 | HR 86 | Temp 98.0°F | Ht 65.0 in | Wt 291.8 lb

## 2022-05-11 DIAGNOSIS — E7800 Pure hypercholesterolemia, unspecified: Secondary | ICD-10-CM | POA: Diagnosis not present

## 2022-05-11 DIAGNOSIS — R7303 Prediabetes: Secondary | ICD-10-CM

## 2022-05-11 DIAGNOSIS — E876 Hypokalemia: Secondary | ICD-10-CM

## 2022-05-11 DIAGNOSIS — I10 Essential (primary) hypertension: Secondary | ICD-10-CM | POA: Diagnosis not present

## 2022-05-11 DIAGNOSIS — R0683 Snoring: Secondary | ICD-10-CM

## 2022-05-11 DIAGNOSIS — Z6841 Body Mass Index (BMI) 40.0 and over, adult: Secondary | ICD-10-CM

## 2022-05-11 LAB — COMPREHENSIVE METABOLIC PANEL
ALT: 15 U/L (ref 0–35)
AST: 18 U/L (ref 0–37)
Albumin: 4 g/dL (ref 3.5–5.2)
Alkaline Phosphatase: 101 U/L (ref 39–117)
BUN: 19 mg/dL (ref 6–23)
CO2: 31 mEq/L (ref 19–32)
Calcium: 9.1 mg/dL (ref 8.4–10.5)
Chloride: 100 mEq/L (ref 96–112)
Creatinine, Ser: 0.97 mg/dL (ref 0.40–1.20)
GFR: 64.43 mL/min (ref >60.00)
Glucose, Bld: 109 mg/dL — ABNORMAL HIGH (ref 70–99)
Potassium: 3.2 mEq/L — ABNORMAL LOW (ref 3.5–5.1)
Sodium: 140 mEq/L (ref 135–145)
Total Bilirubin: 0.7 mg/dL (ref 0.2–1.2)
Total Protein: 6.9 g/dL (ref 6.0–8.3)

## 2022-05-11 LAB — LDL CHOLESTEROL, DIRECT: Direct LDL: 108 mg/dL

## 2022-05-11 LAB — LIPID PANEL
Cholesterol: 166 mg/dL (ref 0–200)
HDL: 39.2 mg/dL (ref >39.00)
LDL Cholesterol: 106 mg/dL — ABNORMAL HIGH (ref 0–99)
NonHDL: 127.12
Total CHOL/HDL Ratio: 4
Triglycerides: 104 mg/dL (ref 0.0–149.0)
VLDL: 20.8 mg/dL (ref 0.0–40.0)

## 2022-05-11 LAB — HEMOGLOBIN A1C: Hgb A1c MFr Bld: 6.2 % (ref 4.6–6.5)

## 2022-05-11 MED ORDER — WEGOVY 0.5 MG/0.5ML ~~LOC~~ SOAJ: 0.5000 mg | SUBCUTANEOUS | 2 refills | Status: DC

## 2022-05-11 NOTE — Progress Notes (Addendum)
Subjective:  Patient ID: Amy House, female    DOB: 07-13-1966  Age: 56 y.o. MRN: [id number]  CC: The primary encounter diagnosis was Primary hypertension. Diagnoses of Pure hypercholesterolemia, Prediabetes, Elevated blood pressure reading in office with white coat syndrome, with diagnosis of hypertension, Morbid obesity with BMI of 45.0-49.9, adult ([place]), and Snoring were also pertinent to this visit.   HPI Amy House presents for follow up on several issues  Chief Complaint  Patient presents with   Follow-up    Follow up on Ozempic   1) Obesity:  started Ozempic but did not lose weight on the 0.25 mg dose and then her pharmacy could not refill until September.  Did not lose weight on 0.25 mg dose .  Had some nausea but no emesis.  Going to the gym and walking on the treadmill.    She reports increased stress at home and not working  due to husband's health condition .  Multiple family issues:  1) son's accident and crush injury of left leg requiring 2 surgeries.  He is doing better , walking but getting deployed to [place] soon ,  on administrative duty.  2) Husband has developed stage 5 kidney disease ,  recently hospitalized and now on hemodialysis.    2) HTN:  taking telmisartan/hct   does not check BP   Outpatient Medications Prior to Visit  Medication Sig Dispense Refill   aspirin 81 MG tablet Take 81 mg by mouth daily.     hydroquinone 4 % cream Apply topically.     mometasone (ELOCON) 0.1 % ointment Apply topically 2 (two) times daily as needed.     telmisartan-hydrochlorothiazide (MICARDIS HCT) 80-25 MG tablet Take 1 tablet by mouth daily. 90 tablet 1   triamcinolone ointment (KENALOG) 0.1 %      valACYclovir (VALTREX) 500 MG tablet Take 1 tablet (500 mg total) by mouth daily. 30 tablet 1   Semaglutide-Weight Management (WEGOVY) 0.5 MG/0.5ML SOAJ Inject 0.5 mg into the skin once a week. (Patient not taking: Reported on 05/11/2022) 2 mL 2   No  facility-administered medications prior to visit.    Review of Systems;  Patient denies headache, fevers, malaise, unintentional weight loss, skin rash, eye pain, sinus congestion and sinus pain, sore throat, dysphagia,  hemoptysis , cough, dyspnea, wheezing, chest pain, palpitations, orthopnea, edema, abdominal pain, nausea, melena, diarrhea, constipation, flank pain, dysuria, hematuria, urinary  Frequency, nocturia, numbness, tingling, seizures,  Focal weakness, Loss of consciousness,  Tremor, insomnia, depression, anxiety, and suicidal ideation.      Objective:  BP (!) 138/94 (BP Location: Left Arm, Patient Position: Sitting, Cuff Size: Large)   Pulse 86   Temp 98 F (36.7 C) (Oral)   Ht '5\' 5"'  (1.651 m)   Wt 291 lb 12.8 oz (132.4 kg)   SpO2 95%   BMI 48.56 kg/m   BP Readings from Last 3 Encounters:  05/11/22 (!) 138/94  03/30/22 136/84  10/06/21 140/80    Wt Readings from Last 3 Encounters:  05/11/22 291 lb 12.8 oz (132.4 kg)  03/30/22 296 lb (134.3 kg)  10/06/21 292 lb (132.5 kg)    General appearance: alert, cooperative and appears stated age Ears: normal TM's and external ear canals both ears Throat: lips, mucosa, and tongue normal; teeth and gums normal Neck: no adenopathy, no carotid bruit, supple, symmetrical, trachea midline and thyroid not enlarged, symmetric, no tenderness/mass/nodules Back: symmetric, no curvature. ROM normal. No CVA tenderness. Lungs: clear to auscultation bilaterally  Heart: regular rate and rhythm, S1, S2 normal, no murmur, click, rub or gallop Abdomen: soft, non-tender; bowel sounds normal; no masses,  no organomegaly Pulses: 2+ and symmetric Skin: Skin color, texture, turgor normal. No rashes or lesions Lymph nodes: Cervical, supraclavicular, and axillary nodes normal.  Lab Results  Component Value Date   HGBA1C 6.2 05/11/2022   HGBA1C 6.0 10/06/2021   HGBA1C 6.0 12/30/2020    Lab Results  Component Value Date   CREATININE 0.97  05/11/2022   CREATININE 0.91 10/06/2021   CREATININE 0.91 12/30/2020    Lab Results  Component Value Date   WBC 7.7 10/06/2021   HGB 12.1 10/06/2021   HCT 37.5 10/06/2021   PLT 206.0 10/06/2021   GLUCOSE 109 (H) 05/11/2022   CHOL 166 05/11/2022   TRIG 104.0 05/11/2022   HDL 39.20 05/11/2022   LDLDIRECT 108.0 05/11/2022   LDLCALC 106 (H) 05/11/2022   ALT 15 05/11/2022   AST 18 05/11/2022   NA 140 05/11/2022   K 3.2 (L) 05/11/2022   CL 100 05/11/2022   CREATININE 0.97 05/11/2022   BUN 19 05/11/2022   CO2 31 05/11/2022   TSH 1.26 10/06/2021   HGBA1C 6.2 05/11/2022   MICROALBUR <0.7 10/06/2021    No results found.  Assessment & Plan:   Problem List Items Addressed This Visit     Elevated blood pressure reading in office with white coat syndrome, with diagnosis of hypertension - Primary    REPEAT READING USING THE THIGH CUFF DUE TO LARGE ARM CIRCUMFERENCE WAS 160/84.  SHE  has not been checking a ho,e BPs but has been advised to do so and return for rn visit with home BP machine .  She has had recurrent  hypokalemia,  Raising the question of hyperaldosteronism.  Will dc the hctz,, supplement potassium for 3 days,  And have her return in 2 weeks for bmet and aldosterone/ renin tests    Lab Results  Component Value Date   NA 140 05/11/2022   K 3.2 (L) 05/11/2022   CL 100 05/11/2022   CO2 31 05/11/2022         Hyperlipidemia   Relevant Orders   Direct LDL (Completed)   Lipid Profile (Completed)   Morbid obesity with BMI of 45.0-49.9, adult ([place])    Advised to resume ozempic. Samples given.  Encourage seeking the help of a trainer/health coach to gide exercise efforts.       Relevant Medications   Semaglutide-Weight Management (WEGOVY) 0.5 MG/0.5ML SOAJ   Prediabetes    A1c Is stable. .  She is embarking on weight reduction therapy with Mancel Parsons /ozempiic.   Repeat labs  Every 6 months  Lab Results  Component Value Date   HGBA1C 6.2 05/11/2022          Relevant Orders   Comp Met (CMET) (Completed)   HgB A1c (Completed)   Snoring    She has referred for sleep study in 2019 but deferred.        I spent a total of  40 minutes with this patient in a face to face visit on the date of this encounter reviewing the last office visit with me ,  patient's diet and eating habits, providing counselling on exericse efforts ,  and post visit ordering of testing and therapeutics.    Follow-up: Return in about 1 week (around 05/18/2022).   Crecencio Mc, MD

## 2022-05-11 NOTE — Telephone Encounter (Signed)
Medication Samples have been provided to the patient.  Drug name: Ozempic       Strength: 0.25 mg        Qty: 1 box  LOT: [id number]  Exp.Date: 09/11/2023  Dosing instructions: Inject 0.25 mg into skin once weekly.   The patient has been instructed regarding the correct time, dose, and frequency of taking this medication, including desired effects and most common side effects.   Amy House 8:55 AM 05/11/2022

## 2022-05-11 NOTE — Assessment & Plan Note (Signed)
A1c Is stable. .  She is embarking on weight reduction therapy with Reginal Lutes /ozempiic.   Repeat labs  Every 6 months  Lab Results  Component Value Date   HGBA1C 6.2 05/11/2022

## 2022-05-11 NOTE — Assessment & Plan Note (Signed)
Advised to resume ozempic. Samples given.  Encourage seeking the help of a trainer/health coach to gide exercise efforts.

## 2022-05-11 NOTE — Assessment & Plan Note (Signed)
She has referred for sleep study in 2019 but deferred.

## 2022-05-11 NOTE — Assessment & Plan Note (Addendum)
REPEAT READING USING THE THIGH CUFF DUE TO LARGE ARM CIRCUMFERENCE WAS 160/84.  SHE  has not been checking a ho,e BPs but has been advised to do so and return for rn visit with home BP machine .  She has had recurrent  hypokalemia,  Raising the question of hyperaldosteronism.  Will dc the hctz,, supplement potassium for 3 days,  And have her return in 2 weeks for bmet and aldosterone/ renin tests    Lab Results  Component Value Date   NA 140 05/11/2022   K 3.2 (L) 05/11/2022   CL 100 05/11/2022   CO2 31 05/11/2022

## 2022-05-11 NOTE — Patient Instructions (Addendum)
I am  resuming Ozempic  to help you lose weight  ,    The dose for the first 4 weekly doses has to be at the lowest dose,  which is  is 0.25 mg.  You may have mild nausea on the first or second day but this should resolve.  IF YOU ARE LOSING WEIGHT AT THE 0.25 MG DOSE,  REQUEST A REFILL AT THE SAME DOSE .  IF YOU ARE NOT LOSING WEIGHT  REQUEST THE HIGHER DOSE (0.5 MG )     The goals for optimal blood pressure management  IS 130/80 .  Please check your blood pressure  3 TIMES OVER THE NEXT WEEK AND  return for a NURSE VISIT WITH YOUR HOME MACHINE SO WE CAN CHECK ITS ACCURACY

## 2022-05-13 NOTE — Addendum Note (Signed)
Addended by: Sherlene Shams on: 05/13/2022 10:38 AM   Modules accepted: Orders

## 2022-05-17 ENCOUNTER — Telehealth: Payer: Self-pay

## 2022-05-17 NOTE — Telephone Encounter (Signed)
LMTCB. Pt has already read her mychart message about the results. We need to schedule pt a lab appt in 2 weeks.

## 2022-05-17 NOTE — Telephone Encounter (Signed)
-----   Message from Sherlene Shams, MD sent at 05/13/2022 10:33 AM EDT ----- Message sent but needs to be called:  Your labs are normal except for your potassium, which is low.  I would like to change your blood pressure medication and get rid of the hctz., which may be causing it to drop.  I'll send a separate rx for telmisartan to your pharmacy,  along with a few days of potassium supplementation and I'd like to repeat your potassium in 2 weeks . Please schedule a nonfasting lab appointment  for these labs.

## 2022-05-17 NOTE — Telephone Encounter (Signed)
noted 

## 2022-05-17 NOTE — Telephone Encounter (Signed)
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

## 2022-05-18 ENCOUNTER — Ambulatory Visit: Payer: BC Managed Care – PPO

## 2022-05-18 ENCOUNTER — Other Ambulatory Visit: Payer: Self-pay | Admitting: Internal Medicine

## 2022-05-18 MED ORDER — POTASSIUM CHLORIDE CRYS ER 20 MEQ PO TBCR: 20.0000 meq | EXTENDED_RELEASE_TABLET | Freq: Every day | ORAL | 3 refills | Status: DC

## 2022-05-18 MED ORDER — TELMISARTAN 80 MG PO TABS: 80.0000 mg | ORAL_TABLET | Freq: Every day | ORAL | 1 refills | Status: DC

## 2022-05-18 NOTE — Telephone Encounter (Signed)
Patient called and said her bp medication and potassium medication was never received by her pharmacy. Her pharmacy is [facility] in [place].

## 2022-05-18 NOTE — Telephone Encounter (Signed)
Pt is aware and both a nurse visit and lab appt have been scheduled.

## 2022-05-26 ENCOUNTER — Other Ambulatory Visit (INDEPENDENT_AMBULATORY_CARE_PROVIDER_SITE_OTHER): Payer: BC Managed Care – PPO

## 2022-05-26 ENCOUNTER — Telehealth: Payer: Self-pay | Admitting: *Deleted

## 2022-05-26 ENCOUNTER — Ambulatory Visit (INDEPENDENT_AMBULATORY_CARE_PROVIDER_SITE_OTHER): Payer: BC Managed Care – PPO

## 2022-05-26 VITALS — BP 150/100 | HR 71

## 2022-05-26 DIAGNOSIS — I10 Essential (primary) hypertension: Secondary | ICD-10-CM

## 2022-05-26 DIAGNOSIS — E876 Hypokalemia: Secondary | ICD-10-CM | POA: Diagnosis not present

## 2022-05-26 LAB — BASIC METABOLIC PANEL
BUN: 15 mg/dL (ref 6–23)
CO2: 30 mEq/L (ref 19–32)
Calcium: 9.2 mg/dL (ref 8.4–10.5)
Chloride: 102 mEq/L (ref 96–112)
Creatinine, Ser: 0.92 mg/dL (ref 0.40–1.20)
GFR: 68.64 mL/min (ref >60.00)
Glucose, Bld: 87 mg/dL (ref 70–99)
Potassium: 3.9 mEq/L (ref 3.5–5.1)
Sodium: 140 mEq/L (ref 135–145)

## 2022-05-26 NOTE — Progress Notes (Signed)
Patient here for nurse visit BP check per order from Telephone note 05/17/22.   Patient reports compliance with prescribed BP medications: yes, Telmisartan 80 mg once daily.  Last dose of BP medication: This morning around 7:30 am   BP Readings from Last 3 Encounters:  05/26/22 (!) 150/110  05/11/22 (!) 138/94  03/30/22 136/84   Pulse Readings from Last 3 Encounters:  05/26/22 71  05/11/22 86  10/06/21 78  Pt also brought her own monitor to compare readings this morning. Reading as followed: L wrist:183/108 PR:76  First manual reading as followed & documented in vitals tab. L wrist:180/110 PR:77 O2:76  I had pt sit & relax for about 10-15 mins before rechecking bp once more & reading as above in pt's chart. I informed pt that I would come & tell you her readings personally & see what you suggested. Pt declined to wait due to her having to go back to work & couldn't wait any longer. Asked pt to see if she was feeling any type of way this morning, pt c/o waking up this morning with a HA she states it may be due to the Telmisartan. She also stated that her readings at home have been running this high as well.   Informed pt that note would be sent to PCP for review & her CMA will be reaching out with any changes in therapy.   Patient verbalized understanding of instructions.   Lars Mage Dimas,CMA

## 2022-05-26 NOTE — Telephone Encounter (Signed)
Error

## 2022-05-28 ENCOUNTER — Encounter: Payer: Self-pay | Admitting: Internal Medicine

## 2022-05-30 ENCOUNTER — Other Ambulatory Visit: Payer: Self-pay | Admitting: Internal Medicine

## 2022-05-30 MED ORDER — METOPROLOL SUCCINATE ER 50 MG PO TB24: 50.0000 mg | ORAL_TABLET | Freq: Every day | ORAL | 3 refills | Status: DC

## 2022-06-22 ENCOUNTER — Ambulatory Visit: Payer: BC Managed Care – PPO | Admitting: Internal Medicine

## 2022-07-17 ENCOUNTER — Telehealth: Payer: Self-pay | Admitting: Internal Medicine

## 2022-07-17 NOTE — Telephone Encounter (Signed)
The patient wants to know if she can have a nurse visit instead of office visit for BP check?

## 2022-07-18 ENCOUNTER — Telehealth: Payer: Self-pay

## 2022-07-18 NOTE — Telephone Encounter (Signed)
LMTCB

## 2022-07-18 NOTE — Telephone Encounter (Signed)
See previous message

## 2022-07-18 NOTE — Telephone Encounter (Signed)
Pt called stating she was returning [place] call.  Amy House was unavailable, pt then stated she just needed her BP Checked.   I have scheduled pt on 11/9 @ 9 am nurse visit for BP check

## 2022-07-18 NOTE — Telephone Encounter (Signed)
Noted  

## 2022-07-20 ENCOUNTER — Ambulatory Visit (INDEPENDENT_AMBULATORY_CARE_PROVIDER_SITE_OTHER): Payer: BC Managed Care – PPO

## 2022-07-20 VITALS — BP 142/96 | HR 73

## 2022-07-20 DIAGNOSIS — I10 Essential (primary) hypertension: Secondary | ICD-10-CM

## 2022-07-20 NOTE — Progress Notes (Addendum)
Patient came in for BP check today done with thigh cuff appropriate to patient's arm. Pt does suffer from white coat syndrome. First reading was 130/88 P 73. Patient stated that she felt that BP was higher & asked that I check again. BP was indeed higher at 142/96. Patient is compliant with metoprolol 50 mg XR daily.   Patient asked for Ozempic sample as well in office for the 0.25/0.5 mg dose that was provided & labeled. Pt stated that she had been unable to get from pharmacy. She asked if you were prescribed the Zepbound as of yet as she would like to be prescribed in the future.  After speaking with Dr. Darrick Huntsman given patients hx if white coat syndrome she did not want to go based off of one reading in office. She asked that patient make NV for next week & bring in her BP cuff from home, so that we may check her readings against ours. Patient was agreeable & scheduled upon leaving our office.    I have reviewed the above information and agree with above.   Duncan Dull, MD

## 2022-08-16 ENCOUNTER — Encounter: Payer: Self-pay | Admitting: Internal Medicine

## 2022-08-18 NOTE — Telephone Encounter (Signed)
LMTCB

## 2022-08-22 ENCOUNTER — Telehealth: Payer: Self-pay | Admitting: Internal Medicine

## 2022-08-22 NOTE — Telephone Encounter (Signed)
Patient called office back and said she had to be seen in 2 to 3 days. Office does not have any appointments available until 08/30/2022 with [facility].

## 2022-08-22 NOTE — Telephone Encounter (Signed)
noted 

## 2022-08-22 NOTE — Telephone Encounter (Signed)
Pt stated that she is no longer taking the Telmisartan.

## 2022-08-22 NOTE — Telephone Encounter (Signed)
Spoke with Amy House and informed her that Dr. Darrick Huntsman only wants her to increase the Metoprolol to 100 mg and have some additional lab results. Amy House has scheduled the lab appt and gave a verbal understanding.

## 2022-08-22 NOTE — Telephone Encounter (Signed)
Pt stated that she is having issues with bp being elevated around 210/109. Pt is currently taking metoprolol 50 mg daily in the morning. Pt stated that since changing from Telmisartan her blood pressure has been even more elevated. Pt stated that since blood pressure has been elevated she has started having headaches in the morning. There is no appts available until next week, so I have scheduled pt for 08/30/2022 at 4:30 with PCP.

## 2022-08-22 NOTE — Telephone Encounter (Signed)
Pt called stating her BP has been running high. Pt stated last week it was  200/108. Sent to access nurse

## 2022-08-29 ENCOUNTER — Other Ambulatory Visit (INDEPENDENT_AMBULATORY_CARE_PROVIDER_SITE_OTHER): Payer: BC Managed Care – PPO

## 2022-08-29 ENCOUNTER — Other Ambulatory Visit: Payer: BC Managed Care – PPO

## 2022-08-29 DIAGNOSIS — I10 Essential (primary) hypertension: Secondary | ICD-10-CM | POA: Diagnosis not present

## 2022-08-30 ENCOUNTER — Ambulatory Visit (INDEPENDENT_AMBULATORY_CARE_PROVIDER_SITE_OTHER): Payer: BC Managed Care – PPO | Admitting: Internal Medicine

## 2022-08-30 ENCOUNTER — Encounter: Payer: Self-pay | Admitting: Internal Medicine

## 2022-08-30 VITALS — BP 156/92 | HR 74 | Temp 98.0°F | Ht 65.0 in | Wt 291.0 lb

## 2022-08-30 DIAGNOSIS — Z6841 Body Mass Index (BMI) 40.0 and over, adult: Secondary | ICD-10-CM | POA: Diagnosis not present

## 2022-08-30 DIAGNOSIS — I10 Essential (primary) hypertension: Secondary | ICD-10-CM

## 2022-08-30 MED ORDER — HYDRALAZINE HCL 25 MG PO TABS: 25.0000 mg | ORAL_TABLET | Freq: Three times a day (TID) | ORAL | 1 refills | Status: DC

## 2022-08-30 MED ORDER — ZEPBOUND 5 MG/0.5ML ~~LOC~~ SOAJ: 5.0000 mg | SUBCUTANEOUS | 2 refills | Status: DC

## 2022-08-30 MED ORDER — TELMISARTAN-HCTZ 80-25 MG PO TABS: 1.0000 | ORAL_TABLET | Freq: Every day | ORAL | 1 refills | Status: DC

## 2022-08-30 NOTE — Assessment & Plan Note (Addendum)
She was prescribed Wegovy in January 2023 but did not lose more than 5 lbs the entire year despite exercising.  She had difficulty with continuity due to supply issues  And was advised to resume ozempic. Samples given of the 0.5 mg (2 boxes) .   Will consider transition to zepbound (tier 3)

## 2022-08-30 NOTE — Assessment & Plan Note (Addendum)
The renin angiotension test was not run by lab when she returned in September as directed; however her repeat potassium was normal in September . It appears that she has resumed telmisartan/hct as of Tuesday.  She did not tolerate metoprolol due to headaches. She has had a previous adverse reaction to amlodipine due to rash,  and a  prior trial of clonidine was stopped for unclear reasons.  I have prescribed hydralazine for prn use for SBP > 160 and am referring her to the advanced HTN clinic for management.  She continues to decline a sleep study to rule out secondary causes of htn

## 2022-08-30 NOTE — Patient Instructions (Addendum)
Resume telmisartan /hct 80/25   once daily in the morning  Add hydralazine 25 mg for BP > 160/90   .   Referral to HTN clinic in [place]   Continue ozempic at the 0.5 mg dose for 4 weeks,  then increase to 1 mg (using the 0.5 mg dose,  give yourself 2 injections   Let me know if Zepbound is affordable and we will switch you to it after the ozempic runs out.

## 2022-08-30 NOTE — Progress Notes (Unsigned)
Subjective:  Patient ID: Amy House, female    DOB: June 08, 1966  Age: 56 y.o. MRN: [id number]  CC: There were no encounter diagnoses.   HPI Amy House presents for  Chief Complaint  Patient presents with   Medical Management of Chronic Issues    hypertension   1 HTN:  patient was last seen in August at which time metoprolol was prescribed and testing for hyperaldosteronism was recommended.    She sent a message on Dec 6 through mychart that her BP was 200/110 after taking her metoprolol . Dose was increased to 100 mg and telmisartan was suspended so the hyperaldosteronism labs could be run.  The labs were collected yesterday but she had restarted the telmisartan   Today she states that she is could not tolerate the metoprolol , even at the 50 mg dose because it  caused daily headaches and decreased urine output, so she resumed the telmisartan ( telmisartan /hct ?)  on Tuesday ; the symptoms resolved  and she started diuresing.   Obesity :  has been taking ozempic/ wegovy  for a year,  has only lost 5 lbs.  Not sure what dose she titrated it to because the drug became unavailable ,   currently  at the 0.5 mg dose using samples given .   Outpatient Medications Prior to Visit  Medication Sig Dispense Refill   aspirin 81 MG tablet Take 81 mg by mouth daily.     hydroquinone 4 % cream Apply topically.     mometasone (ELOCON) 0.1 % ointment Apply topically 2 (two) times daily as needed.     Semaglutide-Weight Management (WEGOVY) 0.5 MG/0.5ML SOAJ Inject 0.5 mg into the skin once a week. KEEP ON FILE FOR FUTURE REFILLS 2 mL 2   triamcinolone ointment (KENALOG) 0.1 %      valACYclovir (VALTREX) 500 MG tablet Take 1 tablet (500 mg total) by mouth daily. 30 tablet 1   metoprolol succinate (TOPROL-XL) 50 MG 24 hr tablet Take 1 tablet (50 mg total) by mouth daily. Take with or immediately following a meal. (Patient not taking: Reported on 08/30/2022) 90 tablet 3   No  facility-administered medications prior to visit.    Review of Systems;  Patient denies headache, fevers, malaise, unintentional weight loss, skin rash, eye pain, sinus congestion and sinus pain, sore throat, dysphagia,  hemoptysis , cough, dyspnea, wheezing, chest pain, palpitations, orthopnea, edema, abdominal pain, nausea, melena, diarrhea, constipation, flank pain, dysuria, hematuria, urinary  Frequency, nocturia, numbness, tingling, seizures,  Focal weakness, Loss of consciousness,  Tremor, insomnia, depression, anxiety, and suicidal ideation.      Objective:  BP (!) 156/92   Pulse 74   Temp 98 F (36.7 C) (Oral)   Ht 5\' 5"  (1.651 m)   Wt 291 lb (132 kg)   SpO2 96%   BMI 48.42 kg/m   BP Readings from Last 3 Encounters:  08/30/22 (!) 156/92  07/20/22 (!) 142/96  05/26/22 (!) 150/100    Wt Readings from Last 3 Encounters:  08/30/22 291 lb (132 kg)  05/11/22 291 lb 12.8 oz (132.4 kg)  03/30/22 296 lb (134.3 kg)    Physical Exam  Lab Results  Component Value Date   HGBA1C 6.2 05/11/2022   HGBA1C 6.0 10/06/2021   HGBA1C 6.0 12/30/2020    Lab Results  Component Value Date   CREATININE 0.92 05/26/2022   CREATININE 0.97 05/11/2022   CREATININE 0.91 10/06/2021    Lab Results  Component Value  Date   WBC 7.7 10/06/2021   HGB 12.1 10/06/2021   HCT 37.5 10/06/2021   PLT 206.0 10/06/2021   GLUCOSE 87 05/26/2022   CHOL 166 05/11/2022   TRIG 104.0 05/11/2022   HDL 39.20 05/11/2022   LDLDIRECT 108.0 05/11/2022   LDLCALC 106 (H) 05/11/2022   ALT 15 05/11/2022   AST 18 05/11/2022   NA 140 05/26/2022   K 3.9 05/26/2022   CL 102 05/26/2022   CREATININE 0.92 05/26/2022   BUN 15 05/26/2022   CO2 30 05/26/2022   TSH 1.26 10/06/2021   HGBA1C 6.2 05/11/2022   MICROALBUR <0.7 10/06/2021    No results found.  Assessment & Plan:  .There are no diagnoses linked to this encounter.   I provided 30 minutes of face-to-face time during this encounter reviewing  patient's last visit with me, patient's  most recent visit with cardiology,  nephrology,  and neurology,  recent surgical and non surgical procedures, previous  labs and imaging studies, counseling on currently addressed issues,  and post visit ordering to diagnostics and therapeutics .   Follow-up: No follow-ups on file.   Sherlene Shams, MD

## 2022-08-31 ENCOUNTER — Telehealth: Payer: Self-pay

## 2022-08-31 NOTE — Telephone Encounter (Signed)
Medication Samples have been provided to the patient.  Drug name: Ozempic       Strength: 0.25 mg        Qty: 2 boxes  LOT: [id number]  Exp.Date: 03/10/2024  Dosing instructions: Inject 0.25 mg into skin once weekly.  The patient has been instructed regarding the correct time, dose, and frequency of taking this medication, including desired effects and most common side effects.   Sabrea Sankey 4:27 PM 08/31/2022

## 2022-09-02 LAB — ALDOSTERONE + RENIN ACTIVITY W/ RATIO
ALDO / PRA Ratio: 5.3 Ratio (ref 0.9–28.9)
Aldosterone: 5 ng/dL
Renin Activity: 0.94 ng/mL/h (ref 0.25–5.82)

## 2022-09-07 ENCOUNTER — Telehealth: Payer: Self-pay

## 2022-09-07 ENCOUNTER — Ambulatory Visit (HOSPITAL_BASED_OUTPATIENT_CLINIC_OR_DEPARTMENT_OTHER): Payer: BC Managed Care – PPO | Admitting: Family

## 2022-09-07 ENCOUNTER — Encounter (HOSPITAL_BASED_OUTPATIENT_CLINIC_OR_DEPARTMENT_OTHER): Payer: Self-pay | Admitting: Family

## 2022-09-07 VITALS — BP 150/87 | HR 90 | Ht 65.0 in | Wt 296.9 lb

## 2022-09-07 DIAGNOSIS — R7303 Prediabetes: Secondary | ICD-10-CM | POA: Diagnosis not present

## 2022-09-07 DIAGNOSIS — I10 Essential (primary) hypertension: Secondary | ICD-10-CM

## 2022-09-07 DIAGNOSIS — Z6841 Body Mass Index (BMI) 40.0 and over, adult: Secondary | ICD-10-CM

## 2022-09-07 DIAGNOSIS — R400 Somnolence: Secondary | ICD-10-CM | POA: Diagnosis not present

## 2022-09-07 MED ORDER — SPIRONOLACTONE 25 MG PO TABS: 12.5000 mg | ORAL_TABLET | Freq: Every day | ORAL | 2 refills | Status: DC

## 2022-09-07 MED ORDER — HYDRALAZINE HCL 25 MG PO TABS: 25.0000 mg | ORAL_TABLET | Freq: Two times a day (BID) | ORAL | 1 refills | Status: DC | PRN

## 2022-09-07 NOTE — Patient Instructions (Addendum)
Medication Instructions:  Your physician has recommended you make the following change in your medication:   START Telmisartan-HCTZ  START Spironolactone half tablet (12.5mg ) once daily  Use Hydralazine AS NEEDED for blood pressure >160/90 after your medications    Labwork: Your physician recommends that you return for lab work in 1-2 weeks for cortisol and BMP. Please have these done in the morning. You may do them at the [facility].   Testing/Procedures: Your physician has requested that you have a renal artery duplex. During this test, an ultrasound is used to evaluate blood flow to the kidneys. Allow one hour for this exam. Do not eat after midnight the day before and avoid carbonated beverages. Take your medications as you usually do.  WatchPAT?  Is a FDA cleared portable home sleep study test that uses a watch and 3 points of contact to monitor 7 different channels, including your heart rate, oxygen saturations, body position, snoring, and chest motion.  The study is easy to use from the comfort of your own home and accurately detect sleep apnea.  Before bed, you attach the chest sensor, attached the sleep apnea bracelet to your nondominant hand, and attach the finger probe.  After the study, the raw data is downloaded from the watch and scored for apnea events.   For more information: [email or web address]  Patient Testing Instructions:  Do not put battery into the device until bedtime when you are ready to begin the test. Please call the support number if you need assistance after following the instructions below: 24 hour support line- [phone] or [facility] support at [phone] (option 2)  Download the IntelWatchPAT One" app through the google play store or App Store  Be sure to turn on or enable access to bluetooth in settlings on your smartphone/ device  Make sure no other bluetooth devices are on and within the vicinity of your  smartphone/ device and WatchPAT watch during testing.  Make sure to leave your smart phone/ device plugged in and charging all night.  When ready for bed:  Follow the instructions step by step in the WatchPAT One App to activate the testing device. For additional instructions, including video instruction, visit the WatchPAT One video on Youtube. You can search for WatchPat One within Youtube (video is 4 minutes and 18 seconds) or enter: https://youtube/[email or web address]?[email or web address] Please note: You will be prompted to enter a Pin to connect via bluetooth when starting the test. The PIN will be assigned to you when you receive the test.  The device is disposable, but it recommended that you retain the device until you receive a call letting you know the study has been received and the results have been interpreted.  We will let you know if the study did not transmit to [place] properly after the test is completed. You do not need to call us to confirm the receipt of the test.  Please complete the test within 48 hours of receiving PIN.   Frequently Asked Questions:  What is Watch Dennie Bible one?  A single use fully disposable home sleep apnea testing device and will not need to be returned after completion.  What are the requirements to use WatchPAT one?  The be able to have a successful watchpat one sleep study, you should have your Watch pat one device, your smart phone, watch pat one app, your PIN number and Internet access What type of phone do I need?  You should have a smart phone that uses Android  5.1 and above or any Iphone with IOS 10 and above How can I download the WatchPAT one app?  Based on your device type search for WatchPAT one app either in google play for android devices or APP store for Iphone's Where will I get my PIN for the study?  Your PIN will be provided by your physician's office. It is used for authentication and if you lose/forget your PIN, please reach out to your providers office.  I do not  have Internet at home. Can I do WatchPAT one study?  WatchPAT One needs Internet connection throughout the night to be able to transmit the sleep data. You can use your home/local internet or your cellular's data package. However, it is always recommended to use home/local Internet. It is estimated that between 20MB-30MB will be used with each study.However, the application will be looking for space in the phone to start the study.  What happens if I lose internet or bluetooth connection?  During the internet disconnection, your phone will not be able to transmit the sleep data. All the data, will be stored in your phone. As soon as the internet connection is back on, the phone will being sending the sleep data. During the bluetooth disconnection, WatchPAT one will not be able to to send the sleep data to your phone. Data will be kept in the [facility] one until two devices have bluetooth connection back on. As soon as the connection is back on, WatchPAT one will send the sleep data to the phone.  How long do I need to wear the WatchPAT one?  After you start the study, you should wear the device at least 6 hours.  How far should I keep my phone from the device?  During the night, your phone should be within 15 feet.  What happens if I leave the room for restroom or other reasons?  Leaving the room for any reason will not cause any problem. As soon as your get back to the room, both devices will reconnect and will continue to send the sleep data. Can I use my phone during the sleep study?  Yes, you can use your phone as usual during the study. But it is recommended to put your watchpat one on when you are ready to go to bed.  How will I get my study results?  A soon as you completed your study, your sleep data will be sent to the provider. They will then share the results with you when they are ready.    Follow-Up: 4-6 weeks in [facility] with Dr. Duke Salvia or Gillian Shields, NP    Special  Instructions: Please bring your home blood pressure cuff to your next appointment  DASH Eating Plan DASH stands for Dietary Approaches to Stop Hypertension. The DASH eating plan is a healthy eating plan that has been shown to: Reduce high blood pressure (hypertension). Reduce your risk for type 2 diabetes, heart disease, and stroke. Help with weight loss. What are tips for following this plan? Reading food labels Check food labels for the amount of salt (sodium) per serving. Choose foods with less than 5 percent of the Daily Value of sodium. Generally, foods with less than 300 milligrams (mg) of sodium per serving fit into this eating plan. To find whole grains, look for the word "whole" as the first word in the ingredient list. Shopping Buy products labeled as "low-sodium" or "no salt added." Buy fresh foods. Avoid canned foods and pre-made or  frozen meals. Cooking Avoid adding salt when cooking. Use salt-free seasonings or herbs instead of table salt or sea salt. Check with your health care provider or pharmacist before using salt substitutes. Do not fry foods. Cook foods using healthy methods such as baking, boiling, grilling, roasting, and broiling instead. Cook with heart-healthy oils, such as olive, canola, avocado, soybean, or sunflower oil. Meal planning  Eat a balanced diet that includes: 4 or more servings of fruits and 4 or more servings of vegetables each day. Try to fill one-half of your plate with fruits and vegetables. 6-8 servings of whole grains each day. Less than 6 oz (170 g) of lean meat, poultry, or fish each day. A 3-oz (85-g) serving of meat is about the same size as a deck of cards. One egg equals 1 oz (28 g). 2-3 servings of low-fat dairy each day. One serving is 1 cup (237 mL). 1 serving of nuts, seeds, or beans 5 times each week. 2-3 servings of heart-healthy fats. Healthy fats called omega-3 fatty acids are found in foods such as walnuts, flaxseeds, fortified  milks, and eggs. These fats are also found in cold-water fish, such as sardines, salmon, and mackerel. Limit how much you eat of: Canned or prepackaged foods. Food that is high in trans fat, such as some fried foods. Food that is high in saturated fat, such as fatty meat. Desserts and other sweets, sugary drinks, and other foods with added sugar. Full-fat dairy products. Do not salt foods before eating. Do not eat more than 4 egg yolks a week. Try to eat at least 2 vegetarian meals a week. Eat more home-cooked food and less restaurant, buffet, and fast food. Lifestyle When eating at a restaurant, ask that your food be prepared with less salt or no salt, if possible. If you drink alcohol: Limit how much you use to: 0-1 drink a day for women who are not pregnant. 0-2 drinks a day for men. Be aware of how much alcohol is in your drink. In the [place] one drink equals one 12 oz bottle of beer (355 mL), one 5 oz glass of wine (148 mL), or one 1 oz glass of hard liquor (44 mL). General information Avoid eating more than 2,300 mg of salt a day. If you have hypertension, you may need to reduce your sodium intake to 1,500 mg a day. Work with your health care provider to maintain a healthy body weight or to lose weight. Ask what an ideal weight is for you. Get at least 30 minutes of exercise that causes your heart to beat faster (aerobic exercise) most days of the week. Activities may include walking, swimming, or biking. Work with your health care provider or dietitian to adjust your eating plan to your individual calorie needs. What foods should I eat? Fruits All fresh, dried, or frozen fruit. Canned fruit in natural juice (without added sugar). Vegetables Fresh or frozen vegetables (raw, steamed, roasted, or grilled). Low-sodium or reduced-sodium tomato and vegetable juice. Low-sodium or reduced-sodium tomato sauce and tomato paste. Low-sodium or reduced-sodium canned  vegetables. Grains Whole-grain or whole-wheat bread. Whole-grain or whole-wheat pasta. Brown rice. Orpah Cobbatmeal. Quinoa. Bulgur. Whole-grain and low-sodium cereals. Pita bread. Low-fat, low-sodium crackers. Whole-wheat flour tortillas. Meats and other proteins Skinless chicken or [place]. Ground chicken or [place]. Pork with fat trimmed off. Fish and seafood. Egg whites. Dried beans, peas, or lentils. Unsalted nuts, nut butters, and seeds. Unsalted canned beans. Lean cuts of beef with fat trimmed off. Low-sodium, lean  precooked or cured meat, such as sausages or meat loaves. Dairy Low-fat (1%) or fat-free (skim) milk. Reduced-fat, low-fat, or fat-free cheeses. Nonfat, low-sodium ricotta or cottage cheese. Low-fat or nonfat yogurt. Low-fat, low-sodium cheese. Fats and oils Soft margarine without trans fats. Vegetable oil. Reduced-fat, low-fat, or light mayonnaise and salad dressings (reduced-sodium). Canola, safflower, olive, avocado, soybean, and sunflower oils. Avocado. Seasonings and condiments Herbs. Spices. Seasoning mixes without salt. Other foods Unsalted popcorn and pretzels. Fat-free sweets. The items listed above may not be a complete list of foods and beverages you can eat. Contact a dietitian for more information. What foods should I avoid? Fruits Canned fruit in a light or heavy syrup. Fried fruit. Fruit in cream or butter sauce. Vegetables Creamed or fried vegetables. Vegetables in a cheese sauce. Regular canned vegetables (not low-sodium or reduced-sodium). Regular canned tomato sauce and paste (not low-sodium or reduced-sodium). Regular tomato and vegetable juice (not low-sodium or reduced-sodium). Rosita Fire. Olives. Grains Baked goods made with fat, such as croissants, muffins, or some breads. Dry pasta or rice meal packs. Meats and other proteins Fatty cuts of meat. Ribs. Fried meat. Tomasa Blase. Bologna, salami, and other precooked or cured meats, such as sausages or meat loaves. Fat from  the back of a pig (fatback). Bratwurst. Salted nuts and seeds. Canned beans with added salt. Canned or smoked fish. Whole eggs or egg yolks. Chicken or [place] with skin. Dairy Whole or 2% milk, cream, and half-and-half. Whole or full-fat cream cheese. Whole-fat or sweetened yogurt. Full-fat cheese. Nondairy creamers. Whipped toppings. Processed cheese and cheese spreads. Fats and oils Butter. Stick margarine. Lard. Shortening. Ghee. Bacon fat. Tropical oils, such as coconut, palm kernel, or palm oil. Seasonings and condiments Onion salt, garlic salt, seasoned salt, table salt, and sea salt. Worcestershire sauce. Tartar sauce. Barbecue sauce. Teriyaki sauce. Soy sauce, including reduced-sodium. Steak sauce. Canned and packaged gravies. Fish sauce. Oyster sauce. Cocktail sauce. Store-bought horseradish. Ketchup. Mustard. Meat flavorings and tenderizers. Bouillon cubes. Hot sauces. Pre-made or packaged marinades. Pre-made or packaged taco seasonings. Relishes. Regular salad dressings. Other foods Salted popcorn and pretzels. The items listed above may not be a complete list of foods and beverages you should avoid. Contact a dietitian for more information. Where to find more information National Heart, Lung, and Blood Institute: PopSteam.is American Heart Association: www.heart.org Academy of Nutrition and Dietetics: www.eatright.org National Kidney Foundation: www.kidney.org Summary The DASH eating plan is a healthy eating plan that has been shown to reduce high blood pressure (hypertension). It may also reduce your risk for type 2 diabetes, heart disease, and stroke. When on the DASH eating plan, aim to eat more fresh fruits and vegetables, whole grains, lean proteins, low-fat dairy, and heart-healthy fats. With the DASH eating plan, you should limit salt (sodium) intake to 2,300 mg a day. If you have hypertension, you may need to reduce your sodium intake to 1,500 mg a day. Work with your  health care provider or dietitian to adjust your eating plan to your individual calorie needs. This information is not intended to replace advice given to you by your health care provider. Make sure you discuss any questions you have with your health care provider. Document Revised: 08/01/2019 Document Reviewed: 08/01/2019 Elsevier Patient Education  2023 [employer].

## 2022-09-07 NOTE — Progress Notes (Signed)
[facility] Initial Assessment:    Date:  09/07/2022   ID:  Amy House, DOB 20-Jun-1966, MRN [id number]  PCP:  Sherlene Shams, MD  Cardiologist:  None  Nephrologist:  Referring MD: Sherlene Shams, MD   CC: Hypertension  History of Present Illness:    Amy House is a 56 y.o. female with a hx of HTN, obesity, prediabetes, hypokalemia here to establish care in the [facility].   Prior CT renal stone study 07/2018 with normal adrenal glands.   04/2022 she was started on Metoprolol. In December it was increase and Telmisartan suspended for renin-aldosterone level. Reports event at 50mg  of Metoprolol noted daily headaches and decreased urine output. She resumed Telmisartan-HCTZ with improvement in symptoms. PCP noted she had declined sleep study. At most recent visit with PCP 08/30/22 she was started on hydralazine PRN for SBP >160 and she was referred to [facility].   09/2021 normal TSH. 08/29/22 normal renin aldosterone ratio.  Pleasant lady who lives at home with her husband. She has a son who is in his 30s serving in the 08/31/22 stationed in [employer]. 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  Amy House was diagnosed with hypertension in her early 59s. It has been difficult to control. Blood pressure checked with arm cuff at home. Readings have been 150-160s/90s checked after her medications. she reports tobacco use rarely and quit in 1994. Alcohol use never. For exercise she walks 2 times per week at work for 20 minutes. Plan to start back at 1995. . she eats at home and outside of the home and does follow low sodium diet.   Gets indigestion after hse eats which improves with drinking something. Discussed PRN Tums/Pepcid or a 2 week course of Prilosec.  She is frustrated by not losing weight on Ozempic- she did have 3 months this summer where she did not take it. She is only  taking it using samples due to difficulty obtaining at the pharmacy. We discussed that would not anticipate much weight loss without escalating dose. Zepbound was denied by insurance.   Does not snore. Wakes up feeling tired an often takes an afternoon nap. Prior sleep study >5 year ago.  Previous antihypertensives: Amlodipine- rash Clonidine - discontinued for unclear reason Metoprolol - headache  Secondary Causes of Hypertension  Medications/Herbal: OCP, steroids, stimulants, antidepressants, weight loss medication, immune suppressants, NSAIDs, sympathomimetics, alcohol, caffeine, licorice, ginseng, St. John's wort, chemo  Sleep Apnea - itamar Renal artery stenosis - never Hyperaldosteronism - ormal renin-aldosteron Hyper/hypothyroidism - normal TSH Pheochromocytoma: palpitations, tachycardia, headache, diaphoresis (plasma metanephrines) Cushing's syndrome: Cushingoid facies, central obesity, proximal muscle weakness, and ecchymoses, adrenal incidentaloma (cortisol) Coarctation of the aorta - BP symmetrical  Past Medical History:  Diagnosis Date   Acanthosis nigricans    Bradycardia    COVID-19 virus infection 10/18/2021   Dermatitis    Hyperlipidemia    Hypertension    Obesity (BMI 30-39.9)     Past Surgical History:  Procedure Laterality Date   ABDOMINAL HYSTERECTOMY     SKIN BIOPSY     TONSILLECTOMY  1979    Current Medications: Current Meds  Medication Sig   hydroquinone 4 % cream Apply topically.   mometasone (ELOCON) 0.1 % ointment Apply topically 2 (two) times daily as needed.   Semaglutide-Weight Management (WEGOVY) 0.5 MG/0.5ML SOAJ Inject 0.5 mg into the skin once a week. KEEP ON FILE FOR FUTURE REFILLS  spironolactone (ALDACTONE) 25 MG tablet Take 0.5 tablets (12.5 mg total) by mouth daily.   telmisartan-hydrochlorothiazide (MICARDIS HCT) 80-25 MG tablet Take 1 tablet by mouth daily.   triamcinolone ointment (KENALOG) 0.1 %    valACYclovir (VALTREX) 500  MG tablet Take 1 tablet (500 mg total) by mouth daily.   [DISCONTINUED] aspirin 81 MG tablet Take 81 mg by mouth daily.   [DISCONTINUED] hydrALAZINE (APRESOLINE) 25 MG tablet Take 1 tablet (25 mg total) by mouth 3 (three) times daily.     Allergies:   Amlodipine, Metoprolol, and Penicillins   Social History   Socioeconomic History   Marital status: Married    Spouse name: Not on file   Number of children: Not on file   Years of education: Not on file   Highest education level: Not on file  Occupational History   Not on file  Tobacco Use   Smoking status: Former    Packs/day: 0.50    Types: Cigarettes    Quit date: 04/01/1993    Years since quitting: 29.4   Smokeless tobacco: Never  Vaping Use   Vaping Use: Never used  Substance and Sexual Activity   Alcohol use: No   Drug use: No   Sexual activity: Yes  Other Topics Concern   Not on file  Social History Narrative   Not on file   Social Determinants of Health   Financial Resource Strain: Not on file  Food Insecurity: No Food Insecurity (09/07/2022)   Hunger Vital Sign    Worried About Running Out of Food in the Last Year: Never true    Ran Out of Food in the Last Year: Never true  Transportation Needs: No Transportation Needs (09/07/2022)   PRAPARE - Administrator, Civil Service (Medical): No    Lack of Transportation (Non-Medical): No  Physical Activity: Insufficiently Active (09/07/2022)   Exercise Vital Sign    Days of Exercise per Week: 2 days    Minutes of Exercise per Session: 20 min  Stress: Stress Concern Present (09/07/2022)   [employer] of Occupational Health - Occupational Stress Questionnaire    Feeling of Stress : Very much  Social Connections: Socially Integrated (09/07/2022)   Social Connection and Isolation Panel [NHANES]    Frequency of Communication with Friends and Family: More than three times a week    Frequency of Social Gatherings with Friends and Family: Once a week     Attends Religious Services: 1 to 4 times per year    Active Member of [employer] or Organizations: Yes    Attends Banker Meetings: 1 to 4 times per year    Marital Status: Married     Family History: The patient's family history includes Diabetes in her mother; Hypertension in her father and mother.  ROS:   Please see the history of present illness.     All other systems reviewed and are negative.  EKGs/Labs/Other Studies Reviewed:    EKG:  EKG is  ordered today.  The ekg ordered today demonstrates NSR 90 bpm with poor R wave progression and no acute ST/T wave changes.   Recent Labs: 10/06/2021: Hemoglobin 12.1; Platelets 206.0; TSH 1.26 05/11/2022: ALT 15 05/26/2022: BUN 15; Creatinine, Ser 0.92; Potassium 3.9; Sodium 140   Recent Lipid Panel    Component Value Date/Time   CHOL 166 05/11/2022 0905   TRIG 104.0 05/11/2022 0905   HDL 39.20 05/11/2022 0905   CHOLHDL 4 05/11/2022 0905   VLDL 20.8 05/11/2022  5277   LDLCALC 106 (H) 05/11/2022 0905   LDLDIRECT 108.0 05/11/2022 0905    Physical Exam:   VS:  BP (!) 150/87 (BP Location: Left Arm, Patient Position: Sitting, Cuff Size: Large) Comment (Cuff Size): thigh cuff  Pulse 90   Ht 5\' 5"  (1.651 m)   Wt 296 lb 14.4 oz (134.7 kg)   SpO2 95%   BMI 49.41 kg/m  , BMI Body mass index is 49.41 kg/m. GENERAL:  Well appearing, overweight HEENT: Pupils equal round and reactive, fundi not visualized, oral mucosa unremarkable NECK:  No jugular venous distention, waveform within normal limits, carotid upstroke brisk and symmetric, no bruits, no thyromegaly LYMPHATICS:  No cervical adenopathy LUNGS:  Clear to auscultation bilaterally HEART:  RRR.  PMI not displaced or sustained,S1 and S2 within normal limits, no S3, no S4, no clicks, no rubs, no murmurs ABD:  Flat, positive bowel sounds normal in frequency in pitch, no bruits, no rebound, no guarding, no midline pulsatile mass, no hepatomegaly, no splenomegaly EXT:  2 plus  pulses throughout, no edema, no cyanosis no clubbing SKIN:  No rashes no nodules NEURO:  Cranial nerves II through XII grossly intact, motor grossly intact throughout PSYCH:  Cognitively intact, oriented to person place and time   ASSESSMENT/PLAN:    HTN - BP not at goal of <130/80. Intolerant to Amlodipine (rash), Metoprolol (headache). Notes she is taking Telmisartan and has not yet picked up Telmisartan-HCTZ. Will have her pick up. Given previous hypokalemia, start Spironolactone 12.5mg  QD. Hydralazine 25mg  PRN for SBP >160.  Cortisol and BMP in 1 week.   Renal duplex to rule out stenosis.  BP symmetric, no concern for aortic coarctation.  Plan for sleep study as detailed below.  Unable to participate in PREP due to location but plans to resume exercise at .  Unable to participate in research study as BP cuff did not fit arm well. She will monitor with her current home cuff and bring to f/u visit to assess accuracy.   Obesity / Prediabetes - Weight loss via diet and exercise encouraged. Discussed the impact being overweight would have on cardiovascular risk. On Ozempic per PCP. Discussed low suspicion she would lose much weight without escalation of dose. Encouraged to continue 0.5mg  weekly x 4 weeks then discuss increased ot 1mg  and 2mg  with PCP.   Sleep disordered breathing - STOPBang 5. Notes daytime somnolence and non restorative sleep. Itamar watchpat home sleep study provided in clinic.    Screening for Secondary Hypertension:     09/07/2022    8:49 PM  Causes  Drugs/Herbals N/A     - Comments no supplements  Renovascular HTN Screened  Sleep Apnea Screened  Thyroid Disease Screened  Hyperaldosteronism Screened     - Comments normal renin aldosterone  Pheochromocytoma Screened     - Comments CT 2019 normal adrenals  Cushing's Syndrome Screened  Coarctation of the Aorta N/A     - Comments symmetric BP  Compliance Screened    Relevant Labs/Studies:     Latest Ref Rng & Units 05/26/2022   10:42 AM 05/11/2022    9:05 AM 10/06/2021    9:03 AM  Basic Labs  Sodium 135 - 145 mEq/L 140  140  138   Potassium 3.5 - 5.1 mEq/L 3.9  3.2  3.5   Creatinine 0.40 - 1.20 mg/dL 2020  05/28/2022  05/13/2022        Latest Ref Rng & Units 10/06/2021    9:03 AM 04/23/2020  10:44 AM  Thyroid   TSH 0.35 - 5.50 uIU/mL 1.26  1.43        Latest Ref Rng & Units 08/29/2022    7:36 AM  Renin/Aldosterone   Aldosterone  ng/dL 5              16/10/960412/28/2023   12:25 PM  Renovascular   Renal Artery US Completed Yes      Disposition:    FU with MD/PharmD/APP in 4-6 weeks    Medication Adjustments/Labs and Tests Ordered: Current medicines are reviewed at length with the patient today.  Concerns regarding medicines are outlined above.  Orders Placed This Encounter  Procedures   Basic metabolic panel   Cortisol   EKG 12-Lead   Itamar Sleep Study   VAS US RENAL ARTERY DUPLEX   Meds ordered this encounter  Medications   spironolactone (ALDACTONE) 25 MG tablet    Sig: Take 0.5 tablets (12.5 mg total) by mouth daily.    Dispense:  15 tablet    Refill:  2    Order Specific Question:   Supervising Provider    Answer:   Jodelle RedCHRISTOPHER, BRIDGETTE [[id number]]   hydrALAZINE (APRESOLINE) 25 MG tablet    Sig: Take 1 tablet (25 mg total) by mouth 2 (two) times daily as needed (for systolic blood pressure >160).    Dispense:  30 tablet    Refill:  1    Order Specific Question:   Supervising Provider    Answer:   Jodelle RedCHRISTOPHER, BRIDGETTE [[id number]]     Signed, Alver Sorrowaitlin S Kindal Ponti, NP  09/07/2022 8:50 PM    [facility]

## 2022-09-07 NOTE — Telephone Encounter (Signed)
error 

## 2022-09-13 ENCOUNTER — Telehealth: Payer: Self-pay | Admitting: *Deleted

## 2022-09-13 ENCOUNTER — Encounter (HOSPITAL_BASED_OUTPATIENT_CLINIC_OR_DEPARTMENT_OTHER): Payer: Self-pay

## 2022-09-13 NOTE — Telephone Encounter (Signed)
Elonda Husky notified ok to activate itamar. Per BC[employer]  portal no PA is required.

## 2022-09-21 ENCOUNTER — Ambulatory Visit: Payer: BC Managed Care – PPO | Attending: Family

## 2022-09-21 DIAGNOSIS — I10 Essential (primary) hypertension: Secondary | ICD-10-CM

## 2022-10-01 ENCOUNTER — Encounter (INDEPENDENT_AMBULATORY_CARE_PROVIDER_SITE_OTHER): Payer: BC Managed Care – PPO | Admitting: Cardiology

## 2022-10-01 DIAGNOSIS — G4733 Obstructive sleep apnea (adult) (pediatric): Secondary | ICD-10-CM

## 2022-10-02 ENCOUNTER — Ambulatory Visit: Payer: BC Managed Care – PPO | Attending: Family

## 2022-10-02 NOTE — Procedures (Signed)
SLEEP STUDY REPORT Patient Information Study Date: 10/01/2022 Patient Name: Amy House Patient ID: [id number] Birth Date: 1965-12-02 Age: 57 Gender: Female BMI: 49.2 (W=295 lb, H=5' 5'') Stopbang: 5 Referring Physician: Laurann Montana, NP  TEST DESCRIPTION: Home sleep apnea testing was completed using the WatchPat, a Type 1 device, utilizing peripheral arterial tonometry (PAT), chest movement, actigraphy, pulse oximetry, pulse rate, body position and snore. AHI was calculated with apnea and hypopnea using valid sleep time as the denominator. RDI includes apneas, hypopneas, and RERAs. The data acquired and the scoring of sleep and all associated events were performed in accordance with the recommended standards and specifications as outlined in the AASM Manual for the Scoring of Sleep and Associated Events 2.2.0 (2015).   FINDINGS:   1. Mild Obstructive Sleep Apnea with AHI 8.3/hr.   2. No Central Sleep Apnea with pAHIc 0.2/hr.   3. Oxygen desaturations as low as 88%.   4. Mild to moderate snoring was present. O2 sats were < 88% for 0.1 min.   5. Total sleep time was 5 hrs and 2 min.   6. 32.7% of total sleep time was spent in REM sleep.   7. Prolonged sleep onset latency at 38 min.   8. Normal REM sleep onset latency at 86 min.   9. Total awakenings were 16 .  10. Arrhythmia detection:  Suggestive of possible brief atrial fibrillation lasting 22 seconds.  This is not diagnostic and further testing with outpatient telemetry monitoring is recommended.  DIAGNOSIS: Mild Obstructive Sleep Apnea (G47.33) Possible atrial arrhythmias  RECOMMENDATIONS:   1.  Clinical correlation of these findings is necessary.  The decision to treat obstructive sleep apnea (OSA) is usually based on the presence of apnea symptoms or the presence of associated medical conditions such as Hypertension, Congestive Heart Failure, Atrial Fibrillation or Obesity.  The most common symptoms of OSA are  snoring, gasping for breath while sleeping, daytime sleepiness and fatigue.   2.  Initiating apnea therapy is recommended given the presence of symptoms and/or associated conditions. Recommend proceeding with one of the following:     a.  Auto-CPAP therapy with a pressure range of 5-20cm H2O.     b.  An oral appliance (OA) that can be obtained from certain dentists with expertise in sleep medicine.  These are primarily of use in non-obese patients with mild and moderate disease.     c.  An ENT consultation which may be useful to look for specific causes of obstruction and possible treatment options.     d.  If patient is intolerant to PAP therapy, consider referral to ENT for evaluation for hypoglossal nerve stimulator.   3.  Close follow-up is necessary to ensure success with CPAP or oral appliance therapy for maximum benefit.  4.  A follow-up oximetry study on CPAP is recommended to assess the adequacy of therapy and determine the need for supplemental oxygen or the potential need for Bi-level therapy.  An arterial blood gas to determine the adequacy of baseline ventilation and oxygenation should also be considered.  5.  Healthy sleep recommendations include:  adequate nightly sleep (normal 7-9 hrs/night), avoidance of caffeine after noon and alcohol near bedtime, and maintaining a sleep environment that is cool, dark and quiet.  6.  Weight loss for overweight patients is recommended.  Even modest amounts of weight loss can significantly improve the severity of sleep apnea.  7.  Snoring recommendations include:  weight loss where appropriate, side  sleeping, and avoidance of alcohol before bed.  8.  Operation of motor vehicle should be avoided when sleepy.  9.  Consider outpatient event monitor to assess for silent atrial fibrillation if clinically indicated.  Signature:   Fransico Him, MD; [facility]; [place], [place] Board of Sleep Medicine Electronically Signed: 10/02/2022

## 2022-10-05 ENCOUNTER — Other Ambulatory Visit: Payer: Self-pay | Admitting: Cardiology

## 2022-10-05 ENCOUNTER — Telehealth: Payer: Self-pay | Admitting: *Deleted

## 2022-10-05 DIAGNOSIS — G4733 Obstructive sleep apnea (adult) (pediatric): Secondary | ICD-10-CM

## 2022-10-05 NOTE — Telephone Encounter (Signed)
-----  Message from Sueanne Margarita, MD sent at 10/02/2022  9:59 AM EST ----- Please let patient know that they have sleep apnea and recommend treating with CPAP.  Please order an auto CPAP from 4-15cm H2O with heated humidity and mask of choice.  Order overnight pulse ox on CPAP.  Followup with me in 6 weeks.

## 2022-10-05 NOTE — Telephone Encounter (Signed)
Patient returned a call to me and was given sleep study results and recommendations. She agrees to proceed with CPAP therapy. [facility] notified orders in EPIC.

## 2022-10-05 NOTE — Telephone Encounter (Signed)
Message left to return a call to discuss sleep study results and recommendations. 

## 2022-10-06 ENCOUNTER — Other Ambulatory Visit: Payer: Self-pay | Admitting: Cardiology

## 2022-10-06 ENCOUNTER — Other Ambulatory Visit: Payer: Self-pay | Admitting: *Deleted

## 2022-10-06 DIAGNOSIS — G4736 Sleep related hypoventilation in conditions classified elsewhere: Secondary | ICD-10-CM

## 2022-10-06 DIAGNOSIS — R400 Somnolence: Secondary | ICD-10-CM

## 2022-10-09 ENCOUNTER — Ambulatory Visit (HOSPITAL_BASED_OUTPATIENT_CLINIC_OR_DEPARTMENT_OTHER): Payer: BC Managed Care – PPO | Admitting: Cardiovascular Disease

## 2022-10-13 ENCOUNTER — Telehealth: Payer: Self-pay | Admitting: Cardiovascular Disease

## 2022-10-13 NOTE — Telephone Encounter (Signed)
Returned call to patient, she states that she received a call about getting an oximeter sent to her. She states she is confused about what they are sending her. She says if it is something for her sleep apnea it can be sent but if not she wants it cancelled.   Routing to the sleep team for follow up.

## 2022-10-13 NOTE — Telephone Encounter (Signed)
Patient states she previously discussed having an oximeter sent to her house, but she changed her mind and no longer wants it. She states she does not understand why it is necessary and she would like to put a halt on having it sent out.

## 2022-10-16 ENCOUNTER — Encounter (HOSPITAL_BASED_OUTPATIENT_CLINIC_OR_DEPARTMENT_OTHER): Payer: Self-pay | Admitting: Cardiovascular Disease

## 2022-10-16 ENCOUNTER — Ambulatory Visit (HOSPITAL_BASED_OUTPATIENT_CLINIC_OR_DEPARTMENT_OTHER): Payer: BC Managed Care – PPO | Admitting: Cardiovascular Disease

## 2022-10-16 VITALS — BP 163/98 | HR 82 | Ht 65.0 in | Wt 301.9 lb

## 2022-10-16 DIAGNOSIS — Z5181 Encounter for therapeutic drug level monitoring: Secondary | ICD-10-CM | POA: Diagnosis not present

## 2022-10-16 DIAGNOSIS — G4733 Obstructive sleep apnea (adult) (pediatric): Secondary | ICD-10-CM

## 2022-10-16 DIAGNOSIS — I1A0 Resistant hypertension: Secondary | ICD-10-CM

## 2022-10-16 DIAGNOSIS — I10 Essential (primary) hypertension: Secondary | ICD-10-CM

## 2022-10-16 HISTORY — DX: Obstructive sleep apnea (adult) (pediatric): G47.33

## 2022-10-16 MED ORDER — SPIRONOLACTONE 25 MG PO TABS: 25.0000 mg | ORAL_TABLET | Freq: Every day | ORAL | 3 refills | Status: DC

## 2022-10-16 MED ORDER — HYDRALAZINE HCL 25 MG PO TABS: 25.0000 mg | ORAL_TABLET | Freq: Two times a day (BID) | ORAL | 1 refills | Status: DC

## 2022-10-16 NOTE — Assessment & Plan Note (Signed)
Will work on getting her CPAP.

## 2022-10-16 NOTE — Progress Notes (Signed)
Advanced Hypertension Clinic:    Date:  10/16/2022   ID:  Amy, House 1966/03/14, MRN [id number]  PCP:  Crecencio Mc, MD  Cardiologist:  None  Nephrologist:  Referring MD: Crecencio Mc, MD   CC: Hypertension  History of Present Illness:    Amy House is a 57 y.o. female with a hx of hypertension, GERD, hyperlipidemia, prediabetes, morbid obesity here for follow up.  She was first seen in the [facility] Clinic 08/2022.  She was first diagnosed with hypertension in her 23s.  She first saw Laurann Montana, NP 08/2022.  She was started on metoprolol 04/2022.  The dose was increased and subsequently decreased due to side effects.  She declined sleep study.  She saw her PCP 08/2022 and was given hydralazine to take on an as-needed basis and referred to advanced hypertension clinic.  Labs included normal testing of TSH and renal and/aldosterone.  She discussed frustration about not losing weight despite starting Ozempic.  Zepbound was denied by her insurance.  At her initial visit her blood pressure was 150/87.  She was started on telmisartan/HCTZ as well as spironolactone.  She underwent a sleep study and was found to have mild OSA and CPAP was recommended.  Labs were negative for hyperaldosteronism.  Renal Dopplers were normal 09/2022.  Amy House reports persistent hypertension despite her current medication regimen.  She experiences no significant improvement in blood pressure control and resorts to using hydralazine on an as-needed basis, approximately twice a week, when her blood pressure exceeds 160.   In addition to her concerns about hypertension, Amy House is dissatisfied with her weight management progress while on the starter dose of Ozempic for the past year. Despite making dietary adjustments, such as refraining from second helpings and occasionally skipping dinner, she has not observed substantial weight loss. Her exercise routine consists of walking  for approximately 15 minutes, 3-4 times a week during work hours, during which she reports no chest pain, pressure, or breathing difficulties.  She reports difficulties with sleeping and recalls her last restful night's sleep occurring in early November. She is currently experiencing stress and anxiety related to her husband's health issues, as he is undergoing dialysis and has been hesitant to communicate about his condition. Amy House expresses a desire to consult with a care guide to learn stress management techniques.  Furthermore, she is inquiring about potential procedural interventions for blood pressure management, such as renal denervation. [name] is open to considering adjustments to her medication and lifestyle to enhance her control over hypertension and weight management.  Previous antihypertensives: Amlodipine- rash Clonidine - discontinued for unclear reason Metoprolol - headache   Past Medical History:  Diagnosis Date   Acanthosis nigricans    Bradycardia    COVID-19 virus infection 10/18/2021   Dermatitis    Hyperlipidemia    Hypertension    Obesity (BMI 30-39.9)    OSA (obstructive sleep apnea) 10/16/2022    Past Surgical History:  Procedure Laterality Date   ABDOMINAL HYSTERECTOMY     SKIN BIOPSY     TONSILLECTOMY  1979    Current Medications: Current Meds  Medication Sig   hydroquinone 4 % cream Apply topically.   mometasone (ELOCON) 0.1 % ointment Apply topically 2 (two) times daily as needed.   telmisartan-hydrochlorothiazide (MICARDIS HCT) 80-25 MG tablet Take 1 tablet by mouth daily.   triamcinolone ointment (KENALOG) 0.1 %    valACYclovir (VALTREX) 500 MG tablet Take 1 tablet (500 mg  total) by mouth daily.   [DISCONTINUED] hydrALAZINE (APRESOLINE) 25 MG tablet Take 1 tablet (25 mg total) by mouth 2 (two) times daily as needed (for systolic blood pressure >350).   [DISCONTINUED] spironolactone (ALDACTONE) 25 MG tablet Take 0.5 tablets (12.5 mg  total) by mouth daily.     Allergies:   Amlodipine, Metoprolol, and Penicillins   Social History   Socioeconomic History   Marital status: Married    Spouse name: Not on file   Number of children: Not on file   Years of education: Not on file   Highest education level: Not on file  Occupational History   Not on file  Tobacco Use   Smoking status: Former    Packs/day: 0.50    Types: Cigarettes    Quit date: 04/01/1993    Years since quitting: 29.5   Smokeless tobacco: Never  Vaping Use   Vaping Use: Never used  Substance and Sexual Activity   Alcohol use: No   Drug use: No   Sexual activity: Yes  Other Topics Concern   Not on file  Social History Narrative   Not on file   Social Determinants of Health   Financial Resource Strain: Not on file  Food Insecurity: No Food Insecurity (09/07/2022)   Hunger Vital Sign    Worried About Running Out of Food in the Last Year: Never true    Ran Out of Food in the Last Year: Never true  Transportation Needs: No Transportation Needs (09/07/2022)   PRAPARE - Hydrologist (Medical): No    Lack of Transportation (Non-Medical): No  Physical Activity: Insufficiently Active (09/07/2022)   Exercise Vital Sign    Days of Exercise per Week: 2 days    Minutes of Exercise per Session: 20 min  Stress: Stress Concern Present (09/07/2022)   [place]    Feeling of Stress : Very much  Social Connections: Socially Integrated (09/07/2022)   Social Connection and Isolation Panel [NHANES]    Frequency of Communication with Friends and Family: More than three times a week    Frequency of Social Gatherings with Friends and Family: Once a week    Attends Religious Services: 1 to 4 times per year    Active Member of [employer] or Organizations: Yes    Attends Archivist Meetings: 1 to 4 times per year    Marital Status: Married     Family  History: The patient's family history includes Diabetes in her mother; Hypertension in her father, maternal uncle, and mother; Stroke in her mother.  ROS:   Please see the history of present illness.     All other systems reviewed and are negative.  EKGs/Labs/Other Studies Reviewed:    EKG:  EKG is not ordered today.   Recent Labs: 05/11/2022: ALT 15 05/26/2022: BUN 15; Creatinine, Ser 0.92; Potassium 3.9; Sodium 140   Recent Lipid Panel    Component Value Date/Time   CHOL 166 05/11/2022 0905   TRIG 104.0 05/11/2022 0905   HDL 39.20 05/11/2022 0905   CHOLHDL 4 05/11/2022 0905   VLDL 20.8 05/11/2022 0905   LDLCALC 106 (H) 05/11/2022 0905   LDLDIRECT 108.0 05/11/2022 0905    Physical Exam:   VS:  BP (!) 163/98 (BP Location: Right Arm, Patient Position: Sitting, Cuff Size: Large)   Pulse 82   Ht 5\' 5"  (1.651 m)   Wt (!) 301 lb 14.4 oz (136.9 kg)  SpO2 93%   BMI 50.24 kg/m  , BMI Body mass index is 50.24 kg/m. GENERAL:  Well appearing HEENT: Pupils equal round and reactive, fundi not visualized, oral mucosa unremarkable NECK:  No jugular venous distention, waveform within normal limits, carotid upstroke brisk and  LUNGS:  Clear to auscultation bilaterally HEART:  RRR.  PMI not displaced or sustained,S1 and S2 within normal limits, no S3, no S4, no clicks, no rubs, no murmurs ABD:  Flat, positive bowel sounds normal in frequency in pitch, no bruits, no rebound, no guarding, no midline pulsatile mass, no hepatomegaly, no splenomegaly EXT:  2 plus pulses throughout, no edema, no cyanosis no clubbing SKIN:  No rashes no nodules NEURO:  Cranial nerves II through XII grossly intact, motor grossly intact throughout PSYCH:  Cognitively intact, oriented to person place and time   ASSESSMENT/PLAN:    Resistant hypertension Blood pressure is very elevated.  She is struggling with OSA and caring for her husband on HD.  Will ask Amy Truman Hayward to reach out to her for stress management.   Blood pressure is very elevated here and at home.  We will increase spironolactone to 25mg .  Continue telmisartan/hctz.  Check BMP in one week. Schedule hydralazine 25mg  bid.  Refer to [facility].  She will work on increasing her exercise to 150 minutes weekly.  Recommend getting renal denervation.   Morbid obesity ([place]) Refer to [place] and incres exercise as above.  Increase Ozempic to 1mg .    OSA (obstructive sleep apnea) Will work on getting her CPAP.   Screening for Secondary Hypertension: 3    09/07/2022    8:49 PM 10/16/2022    9:09 AM  Causes  Drugs/Herbals N/A Screened     - Comments no supplements   Renovascular HTN Screened Screened  Sleep Apnea Screened Screened     - Comments  mild OSA.  Thyroid Disease Screened   Hyperaldosteronism Screened      - Comments normal renin aldosterone   Pheochromocytoma Screened      - Comments CT 2019 normal adrenals   Cushing's Syndrome Screened   Coarctation of the Aorta N/A      - Comments symmetric BP   Compliance Screened     Relevant Labs/Studies:    Latest Ref Rng & Units 05/26/2022   10:42 AM 05/11/2022    9:05 AM 10/06/2021    9:03 AM  Basic Labs  Sodium 135 - 145 mEq/L 140  140  138   Potassium 3.5 - 5.1 mEq/L 3.9  3.2  3.5   Creatinine 0.40 - 1.20 mg/dL 0.92  0.97  0.91        Latest Ref Rng & Units 10/06/2021    9:03 AM 04/23/2020   10:44 AM  Thyroid   TSH 0.35 - 5.50 uIU/mL 1.26  1.43                09/21/2022   10:18 AM  Renovascular   Renal Artery [place] Completed Yes     Disposition:    FU with MD/PharmD in 1 month    Medication Adjustments/Labs and Tests Ordered: Current medicines are reviewed at length with the patient today.  Concerns regarding medicines are outlined above.  Orders Placed This Encounter  Procedures   Basic metabolic panel   Ambulatory referral to [facility]   Meds ordered this encounter  Medications   spironolactone (ALDACTONE) 25 MG tablet    Sig: Take 1  tablet (25 mg total) by  mouth daily.    Dispense:  90 tablet    Refill:  3    D/C PREVIOUS RX   hydrALAZINE (APRESOLINE) 25 MG tablet    Sig: Take 1 tablet (25 mg total) by mouth in the morning and at bedtime.    Dispense:  180 tablet    Refill:  1    D/C PREVIOUS RX   Time spent: 45 minutes-Greater than 50% of this time was spent in counseling, explanation of diagnosis, planning of further management, and coordination of care.   Signed, Chilton Si, MD  10/16/2022 12:32 PM    [facility]

## 2022-10-16 NOTE — Patient Instructions (Signed)
Medication Instructions:  INCREASE SPIRONOLACTONE TO 25 MG DAILY   TAKE THE HYDRALAZINE 25 MG TWICE A DAY   Labwork: BMET IN 1 WEEK   Testing/Procedures: NONE   Follow-Up: 12/13/2022 9:30 AM WITH DR Leroy   Any Other Special Instructions Will Be Listed Below (If Applicable).  YOU HAVE BEEN REFERRED TO [facility]. IF YOU DO  NOT HEAR FROM THEM IN 2 WEEKS YOU CAN CALL THEM DIRECTLY AT THE NUMBER HIGHLIGHTED    If you need a refill on your cardiac medications before your next appointment, please call your pharmacy.

## 2022-10-16 NOTE — Assessment & Plan Note (Signed)
Blood pressure is very elevated.  She is struggling with OSA and caring for her husband on HD.  Will ask Amy House to reach out to her for stress management.  Blood pressure is very elevated here and at home.  We will increase spironolactone to 25mg .  Continue telmisartan/hctz.  Check BMP in one week. Schedule hydralazine 25mg  bid.  Refer to [facility].  She will work on increasing her exercise to 150 minutes weekly.  Recommend getting renal denervation.

## 2022-10-16 NOTE — Assessment & Plan Note (Signed)
Refer to [place] and incres exercise as above.  Increase Ozempic to 1mg .

## 2022-10-17 NOTE — Telephone Encounter (Signed)
Returned a call to the patient and informed her that Dr Radford Pax always orders a ONO on her patients while they sleep on CPAP for one night to see what their oxygen level is. Patient voiced understanding and states that she will call the DME company back. She also had questions about the cost. I told her she will need to ask the DME company.

## 2022-10-18 ENCOUNTER — Telehealth: Payer: Self-pay

## 2022-10-18 DIAGNOSIS — Z0000 Encounter for general adult medical examination without abnormal findings: Secondary | ICD-10-CM

## 2022-10-18 NOTE — Telephone Encounter (Signed)
Called patient per health coaching referral for stress management from Dr. Oval Linsey. Patient did not answer. Left voicemail for patient to return call to discuss health coaching.   Avelino Leeds, MS, ERHD, [facility]  Care Guide, Health & Wellness Coach [address], [address] [place] [address] Telephone: [phone] Email: [email or web address]

## 2022-10-25 ENCOUNTER — Telehealth: Payer: Self-pay

## 2022-10-25 DIAGNOSIS — Z0000 Encounter for general adult medical examination without abnormal findings: Secondary | ICD-10-CM

## 2022-10-25 NOTE — Telephone Encounter (Signed)
Returned patient's called regarding her interest in health coaching. Left message for patient to return call.   Avelino Leeds, MS, ERHD, [facility]  Care Guide, Health & Wellness Coach [address] [place] [address] Telephone: [phone] Email: [email or web address]

## 2022-11-13 ENCOUNTER — Encounter (HOSPITAL_BASED_OUTPATIENT_CLINIC_OR_DEPARTMENT_OTHER): Payer: Self-pay | Admitting: Cardiovascular Disease

## 2022-11-13 DIAGNOSIS — I10 Essential (primary) hypertension: Secondary | ICD-10-CM

## 2022-11-13 MED ORDER — HYDRALAZINE HCL 25 MG PO TABS: 25.0000 mg | ORAL_TABLET | Freq: Two times a day (BID) | ORAL | 3 refills | Status: AC

## 2022-11-13 MED ORDER — SPIRONOLACTONE 25 MG PO TABS: 25.0000 mg | ORAL_TABLET | Freq: Every day | ORAL | 3 refills | Status: AC

## 2022-11-18 ENCOUNTER — Telehealth: Payer: Self-pay | Admitting: *Deleted

## 2022-11-18 NOTE — Telephone Encounter (Addendum)
Late Entry March 4th no contact - void order Received: 5 days ago Sheran Lawless, Hermenia Fiscal, [place]; Lauralee Evener, CMA; Miquel Dunn Good morning ladies!  Wanted to let you know that we have not yet been successful getting this patient scheduled for her PAP set up.  We've spoken with her a couple of times and she advises she's not ready for payment.  We've called and left messages multiple times as well.  Let us know if you hear from her.  Thank you! Melissa

## 2022-11-20 ENCOUNTER — Encounter: Payer: Self-pay | Admitting: Internal Medicine

## 2022-11-20 DIAGNOSIS — Z1231 Encounter for screening mammogram for malignant neoplasm of breast: Secondary | ICD-10-CM

## 2022-12-01 ENCOUNTER — Encounter: Payer: Self-pay | Admitting: Cardiology

## 2022-12-06 LAB — BASIC METABOLIC PANEL
BUN/Creatinine Ratio: 16 (ref 9–23)
BUN: 15 mg/dL (ref 6–24)
CO2: 25 mmol/L (ref 20–29)
Calcium: 9.2 mg/dL (ref 8.7–10.2)
Chloride: 98 mmol/L (ref 96–106)
Creatinine, Ser: 0.92 mg/dL (ref 0.57–1.00)
Glucose: 141 mg/dL — ABNORMAL HIGH (ref 70–99)
Potassium: 3.9 mmol/L (ref 3.5–5.2)
Sodium: 138 mmol/L (ref 134–144)
eGFR: 72 mL/min/{1.73_m2} (ref >59)

## 2022-12-13 ENCOUNTER — Telehealth (HOSPITAL_BASED_OUTPATIENT_CLINIC_OR_DEPARTMENT_OTHER): Payer: Self-pay | Admitting: *Deleted

## 2022-12-13 ENCOUNTER — Ambulatory Visit (HOSPITAL_BASED_OUTPATIENT_CLINIC_OR_DEPARTMENT_OTHER): Payer: BC Managed Care – PPO | Admitting: Cardiovascular Disease

## 2022-12-13 ENCOUNTER — Encounter (HOSPITAL_BASED_OUTPATIENT_CLINIC_OR_DEPARTMENT_OTHER): Payer: Self-pay | Admitting: Cardiovascular Disease

## 2022-12-13 VITALS — BP 141/83 | HR 83 | Ht 65.0 in | Wt 301.0 lb

## 2022-12-13 DIAGNOSIS — G4733 Obstructive sleep apnea (adult) (pediatric): Secondary | ICD-10-CM

## 2022-12-13 DIAGNOSIS — E7800 Pure hypercholesterolemia, unspecified: Secondary | ICD-10-CM

## 2022-12-13 DIAGNOSIS — I1A0 Resistant hypertension: Secondary | ICD-10-CM | POA: Diagnosis not present

## 2022-12-13 DIAGNOSIS — E119 Type 2 diabetes mellitus without complications: Secondary | ICD-10-CM

## 2022-12-13 MED ORDER — ZEPBOUND 2.5 MG/0.5ML ~~LOC~~ SOAJ: 2.5000 mg | SUBCUTANEOUS | 0 refills | Status: DC

## 2022-12-13 NOTE — Progress Notes (Signed)
Advanced Hypertension Clinic:    Date:  12/13/2022   ID:  Amy House, DOB 1966/03/29, MRN [id number]  PCP:  Sherlene Shams, MD  Cardiologist:  None  Nephrologist:  Referring MD: Sherlene Shams, MD   CC: Hypertension  History of Present Illness:    Amy House is a 57 y.o. female with a hx of hypertension, GERD, hyperlipidemia, prediabetes, morbid obesity here for follow up.  She was first seen in the Advanced Hypertension Clinic 08/2022.  She was first diagnosed with hypertension in her 48s.  She first saw Amy Shields, NP 08/2022.  She was started on metoprolol 04/2022.  The dose was increased and subsequently decreased due to side effects.  She declined sleep study.  She saw her PCP 08/2022 and was given hydralazine to take on an as-needed basis and referred to advanced hypertension clinic.  Labs included normal testing of TSH and renal and/aldosterone.  She discussed frustration about not losing weight despite starting Ozempic.  Zepbound was denied by her insurance.  At her initial visit her blood pressure was 150/87.  She was started on telmisartan/HCTZ as well as spironolactone.  She underwent a sleep study and was found to have mild OSA and CPAP was recommended.  Labs were negative for hyperaldosteronism.  Renal Dopplers were normal 09/2022.  Amy House reported persistent hypertension despite taking her medication as prescribed.  She experiences no significant improvement in blood pressure control and resorts to using hydralazine on an as-needed basis, approximately twice a week, when her blood pressure exceeds 160.   In addition to her concerns about hypertension, Amy House was dissatisfied with her weight management progress while on the starter dose of Ozempic for the past year. Despite making dietary adjustments, such as refraining from second helpings and occasionally skipping dinner, she has not observed substantial weight loss. Her exercise routine consists of  walking for approximately 15 minutes, 3-4 times a week during work hours, during which she reports no chest pain, pressure, or breathing difficulties. At her last appointment she reported difficulty with sleep and anxiety dealing with her husband's health issues.  She followed up with our care guide.  Blood pressures remained uncontrolled and spironolactone was increased.  Hydralazine was scheduled into her regimen instead of taking it as needed.    Amy House reports that her blood pressure readings are typically around 140, with occasional readings like 123/77. She takes the diuretic Spironolactone about twice a week as needed, acknowledging that taking it more regularly might help level out her blood pressure.  She expresses difficulty maintaining a regular exercise routine, specifically walking, which she has recently fallen off from but intends to resume.   Amy House describes significant discomfort and challenges with using her CPAP machine for sleep, labeling the experience as "torture." She has had to adjust her sleeping arrangements to avoid disturbing her husband and is seeking alternatives to improve her sleep quality without the CPAP machine.  She is concerned about her blood sugar levels, noting a recent fasting blood sugar of 141. Regarding weight management, Amy House shares her frustration with not experiencing weight loss from Ozempic, despite others' success with similar medications. She is interested in trying ZepBound, hoping for a better outcome, and is also considering non-surgical weight loss therapies before exploring surgical options.  Previous antihypertensives: Amlodipine- rash Clonidine - discontinued for unclear reason Metoprolol - headache   Past Medical History:  Diagnosis Date   Acanthosis nigricans    Bradycardia  COVID-19 virus infection 10/18/2021   Dermatitis    Hyperlipidemia    Hypertension    Obesity (BMI 30-39.9)    OSA (obstructive sleep  apnea) 10/16/2022    Past Surgical History:  Procedure Laterality Date   ABDOMINAL HYSTERECTOMY     SKIN BIOPSY     TONSILLECTOMY  1979    Current Medications: Current Meds  Medication Sig   hydrALAZINE (APRESOLINE) 25 MG tablet Take 1 tablet (25 mg total) by mouth in the morning and at bedtime.   hydroquinone 4 % cream Apply topically.   mometasone (ELOCON) 0.1 % ointment Apply topically 2 (two) times daily as needed.   spironolactone (ALDACTONE) 25 MG tablet Take 1 tablet (25 mg total) by mouth daily.   telmisartan-hydrochlorothiazide (MICARDIS HCT) 80-25 MG tablet Take 1 tablet by mouth daily.   triamcinolone ointment (KENALOG) 0.1 %    valACYclovir (VALTREX) 500 MG tablet Take 1 tablet (500 mg total) by mouth daily.     Allergies:   Amlodipine, Metoprolol, and Penicillins   Social History   Socioeconomic History   Marital status: Married    Spouse name: Not on file   Number of children: Not on file   Years of education: Not on file   Highest education level: Not on file  Occupational History   Not on file  Tobacco Use   Smoking status: Former    Packs/day: .5    Types: Cigarettes    Quit date: 04/01/1993    Years since quitting: 29.7   Smokeless tobacco: Never  Vaping Use   Vaping Use: Never used  Substance and Sexual Activity   Alcohol use: No   Drug use: No   Sexual activity: Yes  Other Topics Concern   Not on file  Social History Narrative   Not on file   Social Determinants of Health   Financial Resource Strain: Not on file  Food Insecurity: No Food Insecurity (09/07/2022)   Hunger Vital Sign    Worried About Running Out of Food in the Last Year: Never true    Ran Out of Food in the Last Year: Never true  Transportation Needs: No Transportation Needs (09/07/2022)   PRAPARE - Administrator, Civil Service (Medical): No    Lack of Transportation (Non-Medical): No  Physical Activity: Insufficiently Active (09/07/2022)   Exercise Vital  Sign    Days of Exercise per Week: 2 days    Minutes of Exercise per Session: 20 min  Stress: Stress Concern Present (09/07/2022)   [employer] of Occupational Health - Occupational Stress Questionnaire    Feeling of Stress : Very much  Social Connections: Socially Integrated (09/07/2022)   Social Connection and Isolation Panel [NHANES]    Frequency of Communication with Friends and Family: More than three times a week    Frequency of Social Gatherings with Friends and Family: Once a week    Attends Religious Services: 1 to 4 times per year    Active Member of [employer] or Organizations: Yes    Attends Banker Meetings: 1 to 4 times per year    Marital Status: Married     Family History: The patient's family history includes Diabetes in her mother; Hypertension in her father, maternal uncle, and mother; Stroke in her mother.  ROS:   Please see the history of present illness.     All other systems reviewed and are negative.  EKGs/Labs/Other Studies Reviewed:    EKG:  EKG is not ordered  today.   Recent Labs: 05/11/2022: ALT 15 12/06/2022: BUN 15; Creatinine, Ser 0.92; Potassium 3.9; Sodium 138   Recent Lipid Panel    Component Value Date/Time   CHOL 166 05/11/2022 0905   TRIG 104.0 05/11/2022 0905   HDL 39.20 05/11/2022 0905   CHOLHDL 4 05/11/2022 0905   VLDL 20.8 05/11/2022 0905   LDLCALC 106 (H) 05/11/2022 0905   LDLDIRECT 108.0 05/11/2022 0905    Physical Exam:    VS:  BP (!) 141/83 (BP Location: Left Arm, Patient Position: Sitting, Cuff Size: Large)   Pulse 83   Ht 5\' 5"  (1.651 m)   Wt (!) 301 lb (136.5 kg)   SpO2 98%   BMI 50.09 kg/m  , BMI Body mass index is 50.09 kg/m. GENERAL:  Well appearing HEENT: Pupils equal round and reactive, fundi not visualized, oral mucosa unremarkable NECK:  No jugular venous distention, waveform within normal limits, carotid upstroke brisk and symmetric, no bruits, no thyromegaly LUNGS:  Clear to auscultation  bilaterally HEART:  RRR.  PMI not displaced or sustained,S1 and S2 within normal limits, no S3, no S4, no clicks, no rubs, no murmurs ABD:  Flat, positive bowel sounds normal in frequency in pitch, no bruits, no rebound, no guarding, no midline pulsatile mass, no hepatomegaly, no splenomegaly EXT:  2 plus pulses throughout, no edema, no cyanosis no clubbing SKIN:  No rashes no nodules NEURO:  Cranial nerves II through XII grossly intact, motor grossly intact throughout PSYCH:  Cognitively intact, oriented to person place and time   ASSESSMENT/PLAN:    #  Resistant Hypertension: Patient reports daily intake of Spironolactone, Hydralazine, and Telmisartan. - Blood pressure recorded at 134/90 during the visit. - Continue with the current regimen of Spironolactone, Hydralazine, and Telmisartan. - Encourage resumption of regular walking routine. - Schedule a blood pressure recheck in six months.  # Elevated Fasting Blood Sugar: Fasting blood sugar level noted at 141, with the last A1C being 6.2 in August. - Advise follow-up with primary care physician, Dr. Marja Kays, for discussion on elevated blood sugar and potential diabetes management strategies.  # Obstructive Sleep Apnea: Patient experiencing difficulties with CPAP therapy. - Recommend persistent use of CPAP and engagement with a sleep specialist to address any issues.  #  Morbid Obesity: No significant weight reduction observed with Ozempic. - Discontinue Ozempic. - Initiate ZepBound for weight management and potential diabetes control. - Suggest exploration of non-surgical weight loss avenues, such as participation in the [facility], prior to considering surgical options.   Screening for Secondary Hypertension: 3    09/07/2022    8:49 PM 10/16/2022    9:09 AM  Causes  Drugs/Herbals N/A Screened     - Comments no supplements   Renovascular HTN Screened Screened  Sleep Apnea Screened Screened     -  Comments  mild OSA.  Thyroid Disease Screened   Hyperaldosteronism Screened      - Comments normal renin aldosterone   Pheochromocytoma Screened      - Comments CT 2019 normal adrenals   Cushing's Syndrome Screened   Coarctation of the Aorta N/A      - Comments symmetric BP   Compliance Screened     Relevant Labs/Studies:    Latest Ref Rng & Units 12/06/2022    7:37 AM 05/26/2022   10:42 AM 05/11/2022    9:05 AM  Basic Labs  Sodium 134 - 144 mmol/L 138  140  140   Potassium 3.5 -  5.2 mmol/L 3.9  3.9  3.2   Creatinine 0.57 - 1.00 mg/dL 1.61  0.96  0.45        Latest Ref Rng & Units 10/06/2021    9:03 AM 04/23/2020   10:44 AM  Thyroid   TSH 0.35 - 5.50 uIU/mL 1.26  1.43                09/21/2022   10:18 AM  Renovascular   Renal Artery [place] Completed Yes     Disposition:    FU with MD/PharmD in 05/2023  Time spent: 45 minutes-Greater than 50% of this time was spent in counseling, explanation of diagnosis, planning of further management, and coordination of care.  Medication Adjustments/Labs and Tests Ordered: Current medicines are reviewed at length with the patient today.  Concerns regarding medicines are outlined above.  No orders of the defined types were placed in this encounter.  No orders of the defined types were placed in this encounter.     Signed, Chilton Si, MD  12/13/2022 9:48 AM    [facility]

## 2022-12-13 NOTE — Patient Instructions (Signed)
Medication Instructions:  TAKE MEDICATIONS DAILY AS PRESCRIBED   START ZEPBOUND 2.5 MG WEEKLY YOU WILL NEED TO REACH OUT TO YOUR PRIMARY CARE FOR TITRATION AND FOLLOWING UP  Labwork: NONE  Testing/Procedures: NONE  Follow-Up: 06/05/2023 9:00 AM WITH DR [place]   Any Other Special Instructions Will Be Listed Below (If Applicable). WILL HAVE WANDA REACH OUT TO YOU REGARDING CPAP QUESTIONS   If you need a refill on your cardiac medications before your next appointment, please call your pharmacy.

## 2022-12-13 NOTE — Telephone Encounter (Signed)
Patient in office for visit with Dr Oval Linsey  Has questions regarding CPAP Per Dr Blenda Mounts request will forward to sleep team for them to reach out to patient to discuss

## 2022-12-14 ENCOUNTER — Encounter (HOSPITAL_BASED_OUTPATIENT_CLINIC_OR_DEPARTMENT_OTHER): Payer: Self-pay | Admitting: Cardiovascular Disease

## 2022-12-14 DIAGNOSIS — E119 Type 2 diabetes mellitus without complications: Secondary | ICD-10-CM | POA: Insufficient documentation

## 2022-12-14 HISTORY — DX: Type 2 diabetes mellitus without complications: E11.9

## 2022-12-14 NOTE — Telephone Encounter (Signed)
Reached out to patient and explained the provider has been out on medical leave and has not resulted her pulse oximetry yet and I will call her as soon as she sends me the result with her next step of care. Patient agrees to treatment.

## 2023-01-01 ENCOUNTER — Encounter: Payer: Self-pay | Admitting: Internal Medicine

## 2023-01-01 ENCOUNTER — Ambulatory Visit: Payer: BC Managed Care – PPO | Admitting: Internal Medicine

## 2023-01-01 VITALS — BP 129/75 | HR 82 | Temp 98.0°F | Ht 65.0 in | Wt 301.0 lb

## 2023-01-01 DIAGNOSIS — R5383 Other fatigue: Secondary | ICD-10-CM | POA: Diagnosis not present

## 2023-01-01 DIAGNOSIS — R7303 Prediabetes: Secondary | ICD-10-CM

## 2023-01-01 DIAGNOSIS — E119 Type 2 diabetes mellitus without complications: Secondary | ICD-10-CM

## 2023-01-01 DIAGNOSIS — E7800 Pure hypercholesterolemia, unspecified: Secondary | ICD-10-CM

## 2023-01-01 DIAGNOSIS — I1A0 Resistant hypertension: Secondary | ICD-10-CM | POA: Diagnosis not present

## 2023-01-01 DIAGNOSIS — I10 Essential (primary) hypertension: Secondary | ICD-10-CM

## 2023-01-01 DIAGNOSIS — G4733 Obstructive sleep apnea (adult) (pediatric): Secondary | ICD-10-CM

## 2023-01-01 MED ORDER — ZEPBOUND 2.5 MG/0.5ML ~~LOC~~ SOAJ: 2.5000 mg | SUBCUTANEOUS | 2 refills | Status: AC

## 2023-01-01 MED ORDER — TELMISARTAN-HCTZ 80-25 MG PO TABS: 1.0000 | ORAL_TABLET | Freq: Every day | ORAL | 1 refills | Status: AC

## 2023-01-01 NOTE — Assessment & Plan Note (Signed)
She has been prescribed CPAP and struggling to adjust to its use for the past month.  She is a side sleeper and is frustrated and uncomfortable using it but declines my offer to use relaxing medication

## 2023-01-01 NOTE — Assessment & Plan Note (Addendum)
A1c Is stable. .  She did not lose weight last year with wegovy but is eager to repeat  weight reduction therapy with  Zebound .   She declines Repeat labs  today  Lab Results  Component Value Date   HGBA1C 6.2 05/11/2022

## 2023-01-01 NOTE — Progress Notes (Addendum)
Subjective:  Patient ID: Amy House, female    DOB: 05-08-1966  Age: 57 y.o. MRN: [id number]  CC: The primary encounter diagnosis was Resistant hypertension. Diagnoses of Elevated blood pressure reading in office with white coat syndrome, with diagnosis of hypertension, Pure hypercholesterolemia, Other fatigue, Prediabetes, and OSA (obstructive sleep apnea) were also pertinent to this visit.   HPI JAKIERA EHLER presents for  Chief Complaint  Patient presents with   Medical Management of Chronic Issues    1) HTN:  now attending [facility] hypertension clinic and taking spironolactone, hydralazine,  telmisartan-hctz.  Tolerating meds without issues, readings at work by Child psychotherapist have been 130/80 or less   2) OSA:  diagnosed with sleep study  ,  has been wearing CPAP for the past month . Not tolerating CPAP "at all"  due to discomfort ("too many cords," ),  she is a side sleeper ,  doesn't want meds to relax    3) obesity: with prediabetes:  she  was prescribed zepbound at $1000/month ; didn't lose weight with ozempic . Frustrated at her inability to lose weight.  Not exercising   Outpatient Medications Prior to Visit  Medication Sig Dispense Refill   hydrALAZINE (APRESOLINE) 25 MG tablet Take 1 tablet (25 mg total) by mouth in the morning and at bedtime. 180 tablet 3   hydroquinone 4 % cream Apply topically.     mometasone (ELOCON) 0.1 % ointment Apply topically 2 (two) times daily as needed.     spironolactone (ALDACTONE) 25 MG tablet Take 1 tablet (25 mg total) by mouth daily. 90 tablet 3   triamcinolone ointment (KENALOG) 0.1 %      valACYclovir (VALTREX) 500 MG tablet Take 1 tablet (500 mg total) by mouth daily. 30 tablet 1   telmisartan-hydrochlorothiazide (MICARDIS HCT) 80-25 MG tablet Take 1 tablet by mouth daily. 90 tablet 1   tirzepatide (ZEPBOUND) 2.5 MG/0.5ML Pen Inject 2.5 mg into the skin once a week. Discuss titration with primary care 1 mL 0   No  facility-administered medications prior to visit.    Review of Systems;  Patient denies headache, fevers, malaise, unintentional weight loss, skin rash, eye pain, sinus congestion and sinus pain, sore throat, dysphagia,  hemoptysis , cough, dyspnea, wheezing, chest pain, palpitations, orthopnea, edema, abdominal pain, nausea, melena, diarrhea, constipation, flank pain, dysuria, hematuria, urinary  Frequency, nocturia, numbness, tingling, seizures,  Focal weakness, Loss of consciousness,  Tremor, insomnia, depression, anxiety, and suicidal ideation.      Objective:  BP 129/75   Pulse 82   Temp 98 F (36.7 C) (Oral)   Ht 5\' 5"  (1.651 m)   Wt (!) 301 lb (136.5 kg)   SpO2 97%   BMI 50.09 kg/m   BP Readings from Last 3 Encounters:  01/01/23 129/75  12/13/22 (!) 141/83  10/16/22 (!) 163/98    Wt Readings from Last 3 Encounters:  01/01/23 (!) 301 lb (136.5 kg)  12/13/22 (!) 301 lb (136.5 kg)  10/16/22 (!) 301 lb 14.4 oz (136.9 kg)    Physical Exam Vitals reviewed.  Constitutional:      General: She is not in acute distress.    Appearance: Normal appearance. She is normal weight. She is not ill-appearing, toxic-appearing or diaphoretic.  HENT:     Head: Normocephalic.  Eyes:     General: No scleral icterus.       Right eye: No discharge.        Left eye: No discharge.  Conjunctiva/sclera: Conjunctivae normal.  Cardiovascular:     Rate and Rhythm: Normal rate and regular rhythm.     Heart sounds: Normal heart sounds.  Pulmonary:     Effort: Pulmonary effort is normal. No respiratory distress.     Breath sounds: Normal breath sounds.  Musculoskeletal:        General: Normal range of motion.  Skin:    General: Skin is warm and dry.  Neurological:     General: No focal deficit present.     Mental Status: She is alert and oriented to person, place, and time. Mental status is at baseline.  Psychiatric:        Mood and Affect: Mood normal.        Behavior: Behavior  normal.        Thought Content: Thought content normal.        Judgment: Judgment normal.    Lab Results  Component Value Date   HGBA1C 6.2 05/11/2022   HGBA1C 6.0 10/06/2021   HGBA1C 6.0 12/30/2020    Lab Results  Component Value Date   CREATININE 0.92 12/06/2022   CREATININE 0.92 05/26/2022   CREATININE 0.97 05/11/2022    Lab Results  Component Value Date   WBC 7.7 10/06/2021   HGB 12.1 10/06/2021   HCT 37.5 10/06/2021   PLT 206.0 10/06/2021   GLUCOSE 141 (H) 12/06/2022   CHOL 166 05/11/2022   TRIG 104.0 05/11/2022   HDL 39.20 05/11/2022   LDLDIRECT 108.0 05/11/2022   LDLCALC 106 (H) 05/11/2022   ALT 15 05/11/2022   AST 18 05/11/2022   NA 138 12/06/2022   K 3.9 12/06/2022   CL 98 12/06/2022   CREATININE 0.92 12/06/2022   BUN 15 12/06/2022   CO2 25 12/06/2022   TSH 1.26 10/06/2021   HGBA1C 6.2 05/11/2022   MICROALBUR <0.7 10/06/2021    No results found.  Assessment & Plan:  .Resistant hypertension Assessment & Plan: She reports that her readings are < 130/80 when checked at work by her office RN.  Continue spironolactone , hydralazine and telmisartan/hct;  encouaraged her to continue to work on adjusting to CPAP    Elevated blood pressure reading in office with white coat syndrome, with diagnosis of hypertension -     Telmisartan-HCTZ; Take 1 tablet by mouth daily.  Dispense: 90 tablet; Refill: 1 -     Microalbumin / creatinine urine ratio; Future -     Comprehensive metabolic panel; Future  Pure hypercholesterolemia -     LDL cholesterol, direct; Future -     Lipid panel; Future  Other fatigue -     CBC with Differential/Platelet -     TSH; Future  Prediabetes Assessment & Plan: A1c Is stable. .  She did not lose weight last year with wegovy but is eager to repeat  weight reduction therapy with  Zebound .   She declines Repeat labs  today  Lab Results  Component Value Date   HGBA1C 6.2 05/11/2022     Orders: -     Hemoglobin A1c;  Future  OSA (obstructive sleep apnea) Assessment & Plan: She has been prescribed CPAP and struggling to adjust to its use for the past month.  She is a side sleeper and is frustrated and uncomfortable using it but declines my offer to use relaxing medication    Other orders -     Zepbound; Inject 2.5 mg into the skin once a week.  Dispense: 2 mL; Refill: 2  I provided of face-to-face time during this encounter reviewing patient's last visit with me, patient's  most recent visit with  hypertension clinic,   recent sleep study, previous  labs and imaging studies, counseling on  use of CPAP and Zepbound and post visit ordering to diagnostics and therapeutics .   Follow-up: No follow-ups on file.   Sherlene Shams, MD

## 2023-01-01 NOTE — Assessment & Plan Note (Addendum)
She reports that her readings are < 130/80 when checked at work by her office RN.  Continue spironolactone , hydralazine and telmisartan/hct;  encouaraged her to continue to work on adjusting to CPAP

## 2023-01-01 NOTE — Patient Instructions (Signed)
Your blood pressure is at goal as long as it stays < 130/80  .    Let me or Dr Duke Salvia know if you start to see elevations so we can adjust your medications  Continue your current regimen for now  You will be due for complete labs at the end of August   If you can find a pharmacy that has Zepbound,  use the rx I printed to start it.

## 2023-01-30 ENCOUNTER — Encounter (HOSPITAL_BASED_OUTPATIENT_CLINIC_OR_DEPARTMENT_OTHER): Payer: Self-pay | Admitting: Cardiovascular Disease

## 2023-02-23 ENCOUNTER — Encounter: Payer: Self-pay | Admitting: Cardiology

## 2023-02-23 ENCOUNTER — Telehealth: Payer: Self-pay | Admitting: *Deleted

## 2023-02-23 ENCOUNTER — Ambulatory Visit: Payer: BC Managed Care – PPO | Attending: Cardiology | Admitting: Cardiology

## 2023-02-23 VITALS — BP 120/78 | HR 71 | Ht 65.0 in | Wt 304.8 lb

## 2023-02-23 DIAGNOSIS — R400 Somnolence: Secondary | ICD-10-CM

## 2023-02-23 DIAGNOSIS — G4733 Obstructive sleep apnea (adult) (pediatric): Secondary | ICD-10-CM

## 2023-02-23 DIAGNOSIS — I1A0 Resistant hypertension: Secondary | ICD-10-CM

## 2023-02-23 DIAGNOSIS — I10 Essential (primary) hypertension: Secondary | ICD-10-CM

## 2023-02-23 NOTE — Progress Notes (Signed)
Sleep Medicine CONSULT Note    Date:  02/23/2023   ID:  Amy House, DOB 28-Jul-1966, MRN [id number]  PCP:  Sherlene Shams, MD  Cardiologist: Chilton Si, MD  Chief Complaint  Patient presents with   New Patient (Initial Visit)    OSA    History of Present Illness:  Amy House is a 57 y.o. female who is being seen today for the evaluation of obstructive sleep apnea at the request of Chilton Si, MD.  This is a 57 year old female with a history of bradycardia, type 2 diabetes mellitus, hypertension hyperlipidemia and obesity.  She was recently seen by Gillian Shields, NP and complained of excessive daytime sleepiness and snoroing with a STOP-BANG score of 5.  Due to her issues with hypertension as well a sleep study was ordered.  This showed mild obstructive sleep apnea with an AHI of 8.3/hr overall but moderate during REM sleep with a REM AHI of 17/h.  She was placed on auto CPAP from 4 to 15 cm H2O.  She is now referred for sleep medicine consultation for ongoing treatment of her obstructive sleep apnea.  Overnight pulse oximetry showed 13 minutes of O2 saturations less than 88% on CPAP therapy.  She is doing well with her PAP device and thinks that she has gotten used to it.  She tells me that sometimes she will skip a night or 2 because it stresses her out dealing with all the tube. She tolerates the nasal pillow mask and feels the pressure is adequate.  Since going on PAP she feels rested in the am and has no significant daytime sleepiness.  She denies any significant  nasal dryness or nasal congestion but has some mouth dryness likely from breathing through her mouth.  She does not think that he snores.    Past Medical History:  Diagnosis Date   Acanthosis nigricans    Bradycardia    COVID-19 virus infection 10/18/2021   Dermatitis    Diabetes mellitus, type 2 (HCC) 12/14/2022   Hyperlipidemia    Hypertension    Obesity (BMI 30-39.9)    OSA  (obstructive sleep apnea) 10/16/2022    Past Surgical History:  Procedure Laterality Date   ABDOMINAL HYSTERECTOMY     SKIN BIOPSY     TONSILLECTOMY  1979    Current Medications: Current Meds  Medication Sig   hydrALAZINE (APRESOLINE) 25 MG tablet Take 1 tablet (25 mg total) by mouth in the morning and at bedtime.   hydroquinone 4 % cream Apply topically.   mometasone (ELOCON) 0.1 % ointment Apply topically 2 (two) times daily as needed.   spironolactone (ALDACTONE) 25 MG tablet Take 1 tablet (25 mg total) by mouth daily.   telmisartan-hydrochlorothiazide (MICARDIS HCT) 80-25 MG tablet Take 1 tablet by mouth daily.   tirzepatide (ZEPBOUND) 2.5 MG/0.5ML Pen Inject 2.5 mg into the skin once a week.   triamcinolone ointment (KENALOG) 0.1 %    valACYclovir (VALTREX) 500 MG tablet Take 1 tablet (500 mg total) by mouth daily.    Allergies:   Amlodipine, Metoprolol, and Penicillins   Social History   Socioeconomic History   Marital status: Married    Spouse name: Not on file   Number of children: Not on file   Years of education: Not on file   Highest education level: Master's degree (e.g., MA, MS, MEng, MEd, MSW, MBA)  Occupational History   Not on file  Tobacco Use   Smoking status: Former  Packs/day: .5    Types: Cigarettes    Quit date: 04/01/1993    Years since quitting: 29.9   Smokeless tobacco: Never  Vaping Use   Vaping Use: Never used  Substance and Sexual Activity   Alcohol use: No   Drug use: No   Sexual activity: Yes  Other Topics Concern   Not on file  Social History Narrative   Not on file   Social Determinants of Health   Financial Resource Strain: Low Risk  (12/28/2022)   Overall Financial Resource Strain (CARDIA)    Difficulty of Paying Living Expenses: Not hard at all  Food Insecurity: No Food Insecurity (12/28/2022)   Hunger Vital Sign    Worried About Running Out of Food in the Last Year: Never true    Ran Out of Food in the Last Year: Never  true  Transportation Needs: No Transportation Needs (12/28/2022)   PRAPARE - Administrator, Civil Service (Medical): No    Lack of Transportation (Non-Medical): No  Physical Activity: Insufficiently Active (12/28/2022)   Exercise Vital Sign    Days of Exercise per Week: 4 days    Minutes of Exercise per Session: 30 min  Stress: Stress Concern Present (12/28/2022)   [employer] of Occupational Health - Occupational Stress Questionnaire    Feeling of Stress : To some extent  Social Connections: Socially Integrated (12/28/2022)   Social Connection and Isolation Panel [NHANES]    Frequency of Communication with Friends and Family: More than three times a week    Frequency of Social Gatherings with Friends and Family: Three times a week    Attends Religious Services: More than 4 times per year    Active Member of Clubs or Organizations: Yes    Attends Banker Meetings: 1 to 4 times per year    Marital Status: Married     Family History:  The patient's family history includes Diabetes in her mother; Hypertension in her father, maternal uncle, and mother; Stroke in her mother.   ROS:   Please see the history of present illness.    ROS All other systems reviewed and are negative.      No data to display             PHYSICAL EXAM:   VS:  BP 120/78   Pulse 71   Ht 5\' 5"  (1.651 m)   Wt (!) 304 lb 12.8 oz (138.3 kg)   SpO2 99%   BMI 50.72 kg/m    GEN: Well nourished, well developed, in no acute distress  HEENT: normal  Neck: no JVD, carotid bruits, or masses Cardiac: RRR; no murmurs, rubs, or gallops,no edema.  Intact distal pulses bilaterally.  Respiratory:  clear to auscultation bilaterally, normal work of breathing GI: soft, nontender, nondistended, + BS MS: no deformity or atrophy  Skin: warm and dry, no rash Neuro:  Alert and Oriented x 3, Strength and sensation are intact Psych: euthymic mood, full affect  Wt Readings from Last 3  Encounters:  02/23/23 (!) 304 lb 12.8 oz (138.3 kg)  01/01/23 (!) 301 lb (136.5 kg)  12/13/22 (!) 301 lb (136.5 kg)      Studies/Labs Reviewed:   Home sleep study and PAP compliance download  Recent Labs: 05/11/2022: ALT 15 12/06/2022: BUN 15; Creatinine, Ser 0.92; Potassium 3.9; Sodium 138   Additional studies/ records that were reviewed today include:  none    ASSESSMENT:    1. OSA (obstructive sleep apnea)  2. Resistant hypertension      PLAN:  In order of problems listed above:  OSA - The patient is tolerating PAP therapy well without any problems. The PAP download performed by his DME was personally reviewed and interpreted by me today and showed an AHI of 10 /hr on auto CPAP from 4-15 cm H2O with 17% compliance in using more than 4 hours nightly.  The patient has been using and benefiting from PAP use and will continue to benefit from therapy.  -She has a significant mask leak on her download so I am going to get room with the DME to check her mask fit and then get a download in 4 weeks -I thinks she is breathing through her mouth as she wakes up with a dry mouth and also that is contributing to her elevated AHI -Encouraged her to be more compliant with her device -encouraged her to change out her nasal pillows every 4 weeks  2.  Hypertension -BP controlled on exam today -Continue prescription drug management of spironolactone 25 mg daily, telmisartan HCT 80/25 mg daily with as needed refills   Medication Adjustments/Labs and Tests Ordered: Current medicines are reviewed at length with the patient today.  Concerns regarding medicines are outlined above.  Medication changes, Labs and Tests ordered today are listed in the Patient Instructions below.  There are no Patient Instructions on file for this visit.   Signed, Armanda Magic, MD  02/23/2023 10:26 AM    [facility] [address] [place], [place], [place]  [address] Phone: [phone]; Fax:  [phone]

## 2023-02-23 NOTE — Telephone Encounter (Signed)
-----   Message from [place], [place] L sent at 02/23/2023 10:38 AM EDT ----- Bernestine Amass,  Dr. Mayford Knife would like for Ms Ryner to be sent to her DME for a mask fitting. Also, please order her a chin strap.  Thank you, Danford Bad

## 2023-02-23 NOTE — Patient Instructions (Signed)
Medication Instructions:  Your physician recommends that you continue on your current medications as directed. Please refer to the Current Medication list given to you today.  *If you need a refill on your cardiac medications before your next appointment, please call your pharmacy*  Lab Work: None ordered today.  Testing/Procedures: None ordered today.  Follow-Up: At [facility], you and your health needs are our priority.  As part of our continuing mission to provide you with exceptional heart care, we have created designated Provider Care Teams.  These Care Teams include your primary Cardiologist (physician) and Advanced Practice Providers (APPs -  Physician Assistants and Nurse Practitioners) who all work together to provide you with the care you need, when you need it.  Your next appointment:   8 week(s)  The format for your next appointment:   In Person  Provider:   Traci Turner, MD 

## 2023-02-28 ENCOUNTER — Telehealth: Payer: Self-pay

## 2023-02-28 NOTE — Telephone Encounter (Signed)
Called to schedule patient fo r8 week f/u, 04/19/23 booked.

## 2023-02-28 NOTE — Telephone Encounter (Signed)
-----   Message from [place], [place] L sent at 02/23/2023 10:40 AM EDT ----- Regarding: Needs 8 wk F/U with [place] Pho,  Ms Bolejack was seen by Dr. Mayford Knife today and she would like to see her back in 8 weeks. Check-out did not have anything available in that timeframe. I'm not sure how Dr. Mayford Knife prefers to schedule her patients. Could you find somewhere within 8 weeks to squeeze Ms Dove in?  Thank you, Danford Bad

## 2023-02-28 NOTE — Telephone Encounter (Signed)
-----   Message from JOHNS[place], KRISTIE L sent at 02/23/2023 10:40 AM EDT ----- Regarding: Needs 8 wk F/U with Turner Hi Daiveon Markman,  Ms Imbert was seen by Dr. Turner today and she would like to see her back in 8 weeks. Check-out did not have anything available in that timeframe. I'm not sure how Dr. Turner prefers to schedule her patients. Could you find somewhere within 8 weeks to squeeze Ms Rivers in?  Thank you, Kristie  

## 2023-03-02 ENCOUNTER — Encounter: Payer: Self-pay | Admitting: Internal Medicine

## 2023-03-02 LAB — HM MAMMOGRAPHY

## 2023-04-19 ENCOUNTER — Ambulatory Visit: Payer: BC Managed Care – PPO | Admitting: Cardiology

## 2023-05-23 LAB — HEMOGLOBIN A1C: Hemoglobin A1C: 6.3

## 2023-06-05 ENCOUNTER — Encounter (HOSPITAL_BASED_OUTPATIENT_CLINIC_OR_DEPARTMENT_OTHER): Payer: Self-pay

## 2023-06-05 ENCOUNTER — Encounter (HOSPITAL_BASED_OUTPATIENT_CLINIC_OR_DEPARTMENT_OTHER): Payer: BC Managed Care – PPO | Admitting: Cardiovascular Disease

## 2023-07-21 ENCOUNTER — Other Ambulatory Visit: Payer: Self-pay | Admitting: Internal Medicine

## 2023-07-22 ENCOUNTER — Other Ambulatory Visit: Payer: Self-pay | Admitting: Internal Medicine

## 2023-07-22 DIAGNOSIS — I10 Essential (primary) hypertension: Secondary | ICD-10-CM

## 2023-07-23 NOTE — Telephone Encounter (Signed)
Medication was discontinued on 05/30/2022. Is it okay refuse?

## 2023-07-23 NOTE — Telephone Encounter (Signed)
Detailed VM left on pts mobile number encouraging to call and make an appt

## 2023-07-23 NOTE — Telephone Encounter (Signed)
LMTCB. Pt has not been since 08/2022.

## 2023-07-24 ENCOUNTER — Encounter: Payer: Self-pay | Admitting: *Deleted

## 2023-07-27 ENCOUNTER — Encounter: Payer: Self-pay | Admitting: Internal Medicine

## 2024-01-07 ENCOUNTER — Other Ambulatory Visit (HOSPITAL_COMMUNITY)
Admission: RE | Admit: 2024-01-07 | Discharge: 2024-01-07 | Disposition: A | Discharge status: Home / Self Care | Source: Ambulatory Visit | Attending: Certified Nurse Midwife | Admitting: Certified Nurse Midwife

## 2024-01-07 ENCOUNTER — Ambulatory Visit: Admitting: Certified Nurse Midwife

## 2024-01-07 ENCOUNTER — Encounter: Payer: Self-pay | Admitting: Certified Nurse Midwife

## 2024-01-07 VITALS — BP 146/80 | HR 91 | Ht 65.0 in | Wt 274.3 lb

## 2024-01-07 DIAGNOSIS — N898 Other specified noninflammatory disorders of vagina: Secondary | ICD-10-CM | POA: Insufficient documentation

## 2024-01-07 NOTE — Progress Notes (Signed)
    GYNECOLOGY PROGRESS NOTE  Subjective:    Patient ID: Amy House, female    DOB: 1966/05/06, 58 y.o.   MRN: [id number]  HPI  Patient is a 58 y.o. [id number] female who presents for evaluation of vaginal itching for the past month. She was treated 2 weeks ago with diflucan  and monistat 7 days with no relief. She denies odor or discharge.   The following portions of the patient's history were reviewed and updated as appropriate: allergies, current medications, past family history, past medical history, past social history, past surgical history, and problem list.  Review of Systems Genitourinary:positive for itching   Objective:   Blood pressure (!) 146/80, pulse 91, height 5\' 5"  (1.651 m), weight 274 lb 4.8 oz (124.4 kg). Body mass index is 45.65 kg/m. General appearance: alert Pelvic: positive findings: vaginal mucosa atrophic    Assessment:   1. Vaginal itching      Plan:   1. Vaginal itching (Primary) - Cervicovaginal ancillary only -If testing negative, will trial vaginal estrogen for menopausal atrophy

## 2024-01-08 ENCOUNTER — Other Ambulatory Visit: Payer: Self-pay | Admitting: Certified Nurse Midwife

## 2024-01-08 LAB — CERVICOVAGINAL ANCILLARY ONLY
Bacterial Vaginitis (gardnerella): NEGATIVE
Candida Glabrata: NEGATIVE
Candida Vaginitis: NEGATIVE
Chlamydia: NEGATIVE
Comment: NEGATIVE
Comment: NEGATIVE
Comment: NEGATIVE
Comment: NEGATIVE
Comment: NEGATIVE
Comment: NORMAL
Neisseria Gonorrhea: NEGATIVE
Trichomonas: NEGATIVE

## 2024-01-08 MED ORDER — ESTRADIOL 0.1 MG/GM VA CREA: 1.0000 | TOPICAL_CREAM | Freq: Every day | VAGINAL | Status: DC

## 2024-01-14 ENCOUNTER — Other Ambulatory Visit: Payer: Self-pay

## 2024-01-14 MED ORDER — ESTRADIOL 0.1 MG/GM VA CREA: 1.0000 | TOPICAL_CREAM | Freq: Every day | VAGINAL | 0 refills | Status: AC

## 2024-04-11 ENCOUNTER — Encounter
Admission: RE | Disposition: A | Discharge status: Home / Self Care | Payer: Self-pay | Source: Home / Self Care | Attending: Gastroenterology

## 2024-04-11 ENCOUNTER — Ambulatory Visit: Admitting: Anesthesiology

## 2024-04-11 ENCOUNTER — Ambulatory Visit
Admission: RE | Admit: 2024-04-11 | Discharge: 2024-04-11 | Disposition: A | Discharge status: Home / Self Care | Payer: Self-pay | Attending: Gastroenterology | Admitting: Gastroenterology

## 2024-04-11 ENCOUNTER — Other Ambulatory Visit: Payer: Self-pay

## 2024-04-11 DIAGNOSIS — K641 Second degree hemorrhoids: Secondary | ICD-10-CM | POA: Insufficient documentation

## 2024-04-11 DIAGNOSIS — E66813 Obesity, class 3: Secondary | ICD-10-CM | POA: Diagnosis not present

## 2024-04-11 DIAGNOSIS — G4733 Obstructive sleep apnea (adult) (pediatric): Secondary | ICD-10-CM | POA: Diagnosis not present

## 2024-04-11 DIAGNOSIS — I10 Essential (primary) hypertension: Secondary | ICD-10-CM | POA: Insufficient documentation

## 2024-04-11 DIAGNOSIS — Z87891 Personal history of nicotine dependence: Secondary | ICD-10-CM | POA: Diagnosis not present

## 2024-04-11 DIAGNOSIS — Z1211 Encounter for screening for malignant neoplasm of colon: Secondary | ICD-10-CM | POA: Diagnosis present

## 2024-04-11 DIAGNOSIS — K5730 Diverticulosis of large intestine without perforation or abscess without bleeding: Secondary | ICD-10-CM | POA: Insufficient documentation

## 2024-04-11 DIAGNOSIS — K648 Other hemorrhoids: Secondary | ICD-10-CM | POA: Insufficient documentation

## 2024-04-11 DIAGNOSIS — Z90710 Acquired absence of both cervix and uterus: Secondary | ICD-10-CM | POA: Insufficient documentation

## 2024-04-11 DIAGNOSIS — Z79899 Other long term (current) drug therapy: Secondary | ICD-10-CM | POA: Diagnosis not present

## 2024-04-11 DIAGNOSIS — Z6841 Body Mass Index (BMI) 40.0 and over, adult: Secondary | ICD-10-CM | POA: Diagnosis not present

## 2024-04-11 DIAGNOSIS — K219 Gastro-esophageal reflux disease without esophagitis: Secondary | ICD-10-CM | POA: Insufficient documentation

## 2024-04-11 HISTORY — DX: Prediabetes: R73.03

## 2024-04-11 HISTORY — PX: COLONOSCOPY: SHX5424

## 2024-04-11 SURGERY — COLONOSCOPY
Anesthesia: General

## 2024-04-11 MED ORDER — PROPOFOL 10 MG/ML IV BOLUS
INTRAVENOUS | Status: DC | PRN
Start: 2024-04-11 — End: 2024-04-11
  Administered 2024-04-11: 20 mg via INTRAVENOUS
  Administered 2024-04-11: 50 mg via INTRAVENOUS
  Administered 2024-04-11: 20 mg via INTRAVENOUS

## 2024-04-11 MED ORDER — SODIUM CHLORIDE 0.9 % IV SOLN: INTRAVENOUS | Status: DC

## 2024-04-11 MED ORDER — LIDOCAINE HCL (CARDIAC) PF 100 MG/5ML IV SOSY
PREFILLED_SYRINGE | INTRAVENOUS | Status: DC | PRN
  Administered 2024-04-11: 100 mg via INTRAVENOUS

## 2024-04-11 MED ORDER — DEXMEDETOMIDINE HCL IN NACL 80 MCG/20ML IV SOLN
INTRAVENOUS | Status: DC | PRN
Start: 2024-04-11 — End: 2024-04-11
  Administered 2024-04-11: 8 ug via INTRAVENOUS
  Administered 2024-04-11: 12 ug via INTRAVENOUS

## 2024-04-11 MED ORDER — PROPOFOL 500 MG/50ML IV EMUL
INTRAVENOUS | Status: DC | PRN
  Administered 2024-04-11: 50 ug/kg/min via INTRAVENOUS

## 2024-04-11 NOTE — Interval H&P Note (Signed)
 History and Physical Interval Note:  04/11/2024 9:46 AM  Amy House  has presented today for surgery, with the diagnosis of screening for colon.  The various methods of treatment have been discussed with the patient and family. After consideration of risks, benefits and other options for treatment, the patient has consented to  Procedure(s) with comments: COLONOSCOPY (N/A) - Mounjaro as a surgical intervention.  The patient's history has been reviewed, patient examined, no change in status, stable for surgery.  I have reviewed the patient's chart and labs.  Questions were answered to the patient's satisfaction.     [name]  Ok to proceed with colonoscopy

## 2024-04-11 NOTE — Anesthesia Postprocedure Evaluation (Signed)
 Anesthesia Post Note  Patient: Amy House  Procedure(s) Performed: COLONOSCOPY  Patient location during evaluation: PACU Anesthesia Type: General Level of consciousness: awake and alert Pain management: pain level controlled Vital Signs Assessment: post-procedure vital signs reviewed and stable Respiratory status: spontaneous breathing, nonlabored ventilation and respiratory function stable Cardiovascular status: blood pressure returned to baseline and stable Postop Assessment: no apparent nausea or vomiting Anesthetic complications: no   No notable events documented.   Last Vitals:  Vitals:   04/11/24 1020 04/11/24 1027  BP: 104/60 91/60  Pulse: 69 69  Resp: 18 20  Temp:    SpO2: 96% 96%    Last Pain:  Vitals:   04/11/24 1027  TempSrc:   PainSc: 0-No pain                 Camellia Merilee Louder

## 2024-04-11 NOTE — Anesthesia Preprocedure Evaluation (Signed)
 Anesthesia Evaluation  Patient identified by MRN, date of birth, ID band Patient awake    Reviewed: Allergy & Precautions, H&P , NPO status , Patient's Chart, lab work & pertinent test results  Airway Mallampati: IV  TM Distance: >3 FB Neck ROM: full    Dental no notable dental hx.    Pulmonary sleep apnea , former smoker   Pulmonary exam normal        Cardiovascular hypertension, Normal cardiovascular exam     Neuro/Psych negative neurological ROS  negative psych ROS   GI/Hepatic Neg liver ROS,GERD  Controlled,,  Endo/Other    Class 3 obesity  Renal/GU negative Renal ROS  negative genitourinary   Musculoskeletal   Abdominal  (+) + obese  Peds  Hematology negative hematology ROS (+)   Anesthesia Other Findings Past Medical History: No date: Acanthosis nigricans No date: Bradycardia 10/18/2021: COVID-19 virus infection No date: Dermatitis No date: Hyperlipidemia No date: Hypertension No date: Obesity (BMI 30-39.9) 10/16/2022: OSA (obstructive sleep apnea) No date: Pre-diabetes  Past Surgical History: No date: ABDOMINAL HYSTERECTOMY No date: SKIN BIOPSY 1979: TONSILLECTOMY  BMI    Body Mass Index: 46.93 kg/m      Reproductive/Obstetrics negative OB ROS                              Anesthesia Physical Anesthesia Plan  ASA: 3  Anesthesia Plan: General   Post-op Pain Management: Minimal or no pain anticipated   Induction: Intravenous  PONV Risk Score and Plan: Propofol infusion and TIVA  Airway Management Planned: Natural Airway and Nasal Cannula  Additional Equipment:   Intra-op Plan:   Post-operative Plan:   Informed Consent: I have reviewed the patients History and Physical, chart, labs and discussed the procedure including the risks, benefits and alternatives for the proposed anesthesia with the patient or authorized representative who has indicated his/her  understanding and acceptance.     Dental Advisory Given  Plan Discussed with: CRNA and Surgeon  Anesthesia Plan Comments:         Anesthesia Quick Evaluation

## 2024-04-11 NOTE — Op Note (Signed)
 [facility] Patient Name: Amy House Procedure Date: 04/11/2024 9:37 AM MRN: [id number] Account #: [id number] Date of Birth: 22-Oct-1965 Admit Type: Outpatient Age: 58 Room: [facility] ENDO ROOM 3 Gender: Female Note Status: Finalized Instrument Name: [id number] [id number] Procedure:             Colonoscopy Indications:           Screening for colorectal malignant neoplasm Providers:             Ole Schick MD, MD Medicines:             Monitored Anesthesia Care Complications:         No immediate complications. Procedure:             Pre-Anesthesia Assessment:                        - Prior to the procedure, a History and Physical was                         performed, and patient medications and allergies were                         reviewed. The patient is competent. The risks and                         benefits of the procedure and the sedation options and                         risks were discussed with the patient. All questions                         were answered and informed consent was obtained.                         Patient identification and proposed procedure were                         verified by the physician, the nurse, the                         anesthesiologist, the anesthetist and the technician                         in the endoscopy suite. Mental Status Examination:                         alert and oriented. Airway Examination: normal                         oropharyngeal airway and neck mobility. Respiratory                         Examination: clear to auscultation. CV Examination:                         normal. Prophylactic Antibiotics: The patient does not                         require prophylactic antibiotics. Prior  Anticoagulants: The patient has taken no anticoagulant                         or antiplatelet agents. ASA Grade Assessment: III - A                         patient with  severe systemic disease. After reviewing                         the risks and benefits, the patient was deemed in                         satisfactory condition to undergo the procedure. The                         anesthesia plan was to use monitored anesthesia care                         (MAC). Immediately prior to administration of                         medications, the patient was re-assessed for adequacy                         to receive sedatives. The heart rate, respiratory                         rate, oxygen saturations, blood pressure, adequacy of                         pulmonary ventilation, and response to care were                         monitored throughout the procedure. The physical                         status of the patient was re-assessed after the                         procedure.                        After obtaining informed consent, the colonoscope was                         passed under direct vision. Throughout the procedure,                         the patient's blood pressure, pulse, and oxygen                         saturations were monitored continuously. The                         Colonoscope was introduced through the anus and                         advanced to the the cecum, identified by appendiceal  orifice and ileocecal valve. The colonoscopy was                         performed without difficulty. The patient tolerated                         the procedure well. The quality of the bowel                         preparation was good. The ileocecal valve, appendiceal                         orifice, and rectum were photographed. Findings:      The perianal and digital rectal examinations were normal.      Multiple small-mouthed diverticula were found in the sigmoid colon.      Internal hemorrhoids were found during retroflexion. The hemorrhoids       were Grade II (internal hemorrhoids that prolapse but reduce        spontaneously).      The exam was otherwise without abnormality on direct and retroflexion       views. Impression:            - Diverticulosis in the sigmoid colon.                        - Internal hemorrhoids.                        - The examination was otherwise normal on direct and                         retroflexion views.                        - No specimens collected. Recommendation:        - Discharge patient to home.                        - Resume previous diet.                        - Continue present medications.                        - Repeat colonoscopy in 10 years for screening                         purposes.                        - Return to referring physician as previously                         scheduled. Procedure Code(s):     --- Professional ---                        H9878, Colorectal cancer screening; colonoscopy on                         individual not meeting criteria for high risk Diagnosis Code(s):     --- Professional ---  Z12.11, Encounter for screening for malignant neoplasm                         of colon                        K64.1, Second degree hemorrhoids                        K57.30, Diverticulosis of large intestine without                         perforation or abscess without bleeding CPT copyright 2022 [employer]. All rights reserved. The codes documented in this report are preliminary and upon coder review may  be revised to meet current compliance requirements. Ole Schick MD, MD 04/11/2024 10:11:06 AM Number of Addenda: 0 Note Initiated On: 04/11/2024 9:37 AM Scope Withdrawal Time: 0 hours 8 minutes 20 seconds  Total Procedure Duration: 0 hours 12 minutes 34 seconds  Estimated Blood Loss:  Estimated blood loss: none.      [facility]

## 2024-04-11 NOTE — H&P (Signed)
 Outpatient short stay form Pre-procedure 04/11/2024  Amy ONEIDA Schick, MD  Primary Physician: Auston Reyes BIRCH, MD  Reason for visit:  Screening  History of present illness:    58 y/o lady with history of obesity, hypertension, and OSA here for screening colonoscopy. Last colonoscopy 10 years ago was normal. No blood thinners. No family history of GI malignancies. History of hysterectomy.    Current Facility-Administered Medications:    0.9 %  sodium chloride infusion, , Intravenous, Continuous, Darrian Goodwill, Amy ONEIDA, MD, Last Rate: 20 mL/hr at 04/11/24 0941, Continued from Pre-op at 04/11/24 0941  Medications Prior to Admission  Medication Sig Dispense Refill Last Dose/Taking   telmisartan -hydrochlorothiazide (MICARDIS  HCT) 80-25 MG tablet Take 1 tablet by mouth daily. 90 tablet 1 04/11/2024 at  8:00 AM   tirzepatide  (MOUNJARO) 2.5 MG/0.5ML Pen Inject 2.5 mg into the skin once a week.   03/31/2024   estradiol  (ESTRACE  VAGINAL) 0.1 MG/GM vaginal cream Place 1 Applicatorful vaginally daily. 42.5 g 0    fluconazole  (DIFLUCAN ) 150 MG tablet Take 150 mg by mouth once.      hydrALAZINE  (APRESOLINE ) 25 MG tablet Take 1 tablet (25 mg total) by mouth in the morning and at bedtime. (Patient not taking: Reported on 01/07/2024) 180 tablet 3    hydroquinone 4 % cream Apply topically. (Patient not taking: Reported on 01/07/2024)      mometasone (ELOCON) 0.1 % ointment Apply topically 2 (two) times daily as needed. (Patient not taking: Reported on 01/07/2024)      spironolactone  (ALDACTONE ) 25 MG tablet Take 1 tablet (25 mg total) by mouth daily. (Patient not taking: Reported on 01/07/2024) 90 tablet 3    tirzepatide  (ZEPBOUND ) 2.5 MG/0.5ML Pen Inject 2.5 mg into the skin once a week. (Patient not taking: Reported on 01/07/2024) 2 mL 2    triamcinolone ointment (KENALOG) 0.1 %  (Patient not taking: Reported on 01/07/2024)      valACYclovir  (VALTREX ) 500 MG tablet Take 1 tablet (500 mg total) by mouth daily. 30  tablet 1      Allergies  Allergen Reactions   Amlodipine      Dermatitis    Metoprolol      headache   Penicillins      Past Medical History:  Diagnosis Date   Acanthosis nigricans    Bradycardia    COVID-19 virus infection 10/18/2021   Dermatitis    Hyperlipidemia    Hypertension    Obesity (BMI 30-39.9)    OSA (obstructive sleep apnea) 10/16/2022   Pre-diabetes     Review of systems:  Otherwise negative.    Physical Exam  Gen: Alert, oriented. Appears stated age.  HEENT: PERRLA. Lungs: No respiratory distress CV: RRR Abd: soft, benign, no masses Ext: No edema    Planned procedures: Proceed with colonoscopy. The patient understands the nature of the planned procedure, indications, risks, alternatives and potential complications including but not limited to bleeding, infection, perforation, damage to internal organs and possible oversedation/side effects from anesthesia. The patient agrees and gives consent to proceed.  Please refer to procedure notes for findings, recommendations and patient disposition/instructions.     Amy ONEIDA Schick, MD [facility]

## 2024-04-11 NOTE — Transfer of Care (Signed)
 Immediate Anesthesia Transfer of Care Note  Patient: Amy House  Procedure(s) Performed: COLONOSCOPY  Patient Location: PACU  Anesthesia Type:General  Level of Consciousness: sedated  Airway & Oxygen Therapy: Patient Spontanous Breathing  Post-op Assessment: Report given to RN and Post -op Vital signs reviewed and stable  Post vital signs: Reviewed and stable  Last Vitals:  Vitals Value Taken Time  BP    Temp    Pulse 74 04/11/24 10:10  Resp 14 04/11/24 10:10  SpO2 96 % 04/11/24 10:10  Vitals shown include unfiled device data.  Last Pain:  Vitals:   04/11/24 0905  TempSrc: Temporal  PainSc: 0-No pain         Complications: No notable events documented.

## 2024-07-29 NOTE — Telephone Encounter (Signed)
 open in error
